# Patient Record
Sex: Male | Born: 1975 | Race: Black or African American | Hispanic: No | Marital: Married | Smoking: Never smoker
Health system: Southern US, Community
[De-identification: ages and names within clinical notes are randomized; demographics above are authoritative.]

## PROBLEM LIST (undated history)

## (undated) DIAGNOSIS — I82409 Acute embolism and thrombosis of unspecified deep veins of unspecified lower extremity: Secondary | ICD-10-CM

## (undated) HISTORY — PX: CHOLECYSTECTOMY: SHX55

## (undated) HISTORY — PX: APPENDECTOMY: SHX54

## (undated) HISTORY — PX: GALLBLADDER SURGERY: SHX652

---

## 2011-10-14 ENCOUNTER — Emergency Department (HOSPITAL_COMMUNITY)
Admission: EM | Admit: 2011-10-14 | Discharge: 2011-10-14 | Disposition: A | Discharge status: Home / Self Care | Payer: Self-pay | Attending: Emergency Medicine | Admitting: Emergency Medicine

## 2011-10-14 ENCOUNTER — Encounter: Payer: Self-pay | Admitting: Emergency Medicine

## 2011-10-14 DIAGNOSIS — J111 Influenza due to unidentified influenza virus with other respiratory manifestations: Secondary | ICD-10-CM | POA: Insufficient documentation

## 2011-10-14 NOTE — ED Notes (Signed)
Pt states that he is fever and chills and that he has bodyaches, cough, not feeling well and thinks that his wife is sick at home

## 2011-10-14 NOTE — ED Provider Notes (Signed)
History     CSN: [id number] Arrival date & time: 10/14/2011  9:40 AM   First MD Initiated Contact with Patient 10/14/11 1235      Chief Complaint  Patient presents with  . Cough  . Fever    (Consider location/radiation/quality/duration/timing/severity/associated sxs/prior treatment) Patient is a 35 y.o. male presenting with fever. The history is provided by the patient. No language interpreter was used.  Fever Primary symptoms of the febrile illness include fever, headaches, cough and myalgias. Primary symptoms do not include visual change, wheezing, shortness of breath, abdominal pain, nausea, vomiting, diarrhea, altered mental status or rash. The current episode started today. This is a new problem. The problem has been gradually worsening.  The headache is not associated with visual change.    History reviewed. No pertinent past medical history.  History reviewed. No pertinent past surgical history.  No family history on file.  History  Substance Use Topics  . Smoking status: Not on file  . Smokeless tobacco: Not on file  . Alcohol Use: Not on file      Review of Systems  Constitutional: Positive for fever.  Respiratory: Positive for cough. Negative for shortness of breath and wheezing.   Gastrointestinal: Negative for nausea, vomiting, abdominal pain and diarrhea.  Musculoskeletal: Positive for myalgias.  Skin: Negative for rash.  Neurological: Positive for headaches.  Psychiatric/Behavioral: Negative for altered mental status.  All other systems reviewed and are negative.    Allergies  Review of patient's allergies indicates not on file.  Home Medications  No current outpatient prescriptions on file.  BP 116/79  Pulse 99  Temp(Src) 99.5 F (37.5 C) (Oral)  Resp 24  SpO2 99%  Physical Exam  Constitutional: He is oriented to person, place, and time. He appears well-developed and well-nourished.  HENT:  Head: Normocephalic.  Right Ear: External ear  normal.  Left Ear: External ear normal.  Nose: Nose normal.  Mouth/Throat: Oropharynx is clear and moist. No oropharyngeal exudate.  Eyes: Pupils are equal, round, and reactive to light.  Neck: Normal range of motion.  Cardiovascular: Normal rate and regular rhythm.   Pulmonary/Chest: Effort normal and breath sounds normal. No respiratory distress. He has no wheezes. He exhibits no tenderness.  Abdominal: Soft. Bowel sounds are normal.  Neurological: He is alert and oriented to person, place, and time.  Skin: Skin is warm and dry.  Psychiatric: He has a normal mood and affect.    ED Course  Procedures (including critical care time)  Labs Reviewed - No data to display No results found.   No diagnosis found.    MDM  Influenza with sick contacts.  Treat symptoms with tylenol and motrin.  No acute distress noted.  Took nothing at home.  Afebrile in the ER.  Tolerating po's.        Jethro Bastos, NP 10/16/11 1418  Jethro Bastos, NP 10/16/11 1426

## 2011-10-16 NOTE — ED Provider Notes (Signed)
Medical screening examination/treatment/procedure(s) were performed by non-physician practitioner and as supervising physician I was immediately available for consultation/collaboration.  Jerimy Johanson, MD 10/16/11 1525 

## 2012-01-20 ENCOUNTER — Encounter (HOSPITAL_COMMUNITY): Payer: Self-pay | Admitting: Emergency Medicine

## 2012-01-20 ENCOUNTER — Emergency Department (HOSPITAL_COMMUNITY)
Admission: EM | Admit: 2012-01-20 | Discharge: 2012-01-20 | Disposition: A | Discharge status: Home / Self Care | Payer: Self-pay | Attending: Emergency Medicine | Admitting: Emergency Medicine

## 2012-01-20 DIAGNOSIS — X503XXA Overexertion from repetitive movements, initial encounter: Secondary | ICD-10-CM | POA: Insufficient documentation

## 2012-01-20 DIAGNOSIS — Y990 Civilian activity done for income or pay: Secondary | ICD-10-CM | POA: Insufficient documentation

## 2012-01-20 DIAGNOSIS — Y998 Other external cause status: Secondary | ICD-10-CM | POA: Insufficient documentation

## 2012-01-20 DIAGNOSIS — S335XXA Sprain of ligaments of lumbar spine, initial encounter: Secondary | ICD-10-CM | POA: Insufficient documentation

## 2012-01-20 DIAGNOSIS — B354 Tinea corporis: Secondary | ICD-10-CM | POA: Insufficient documentation

## 2012-01-20 DIAGNOSIS — T148XXA Other injury of unspecified body region, initial encounter: Secondary | ICD-10-CM

## 2012-01-20 MED ORDER — CLOTRIMAZOLE-BETAMETHASONE 1-0.05 % EX CREA: TOPICAL_CREAM | Freq: Two times a day (BID) | CUTANEOUS | Status: AC

## 2012-01-20 MED ORDER — CYCLOBENZAPRINE HCL 10 MG PO TABS: 10.0000 mg | ORAL_TABLET | Freq: Three times a day (TID) | ORAL | Status: AC | PRN

## 2012-01-20 MED ORDER — OXYCODONE-ACETAMINOPHEN 5-325 MG PO TABS: 1.0000 | ORAL_TABLET | Freq: Four times a day (QID) | ORAL | Status: AC | PRN

## 2012-01-20 NOTE — ED Provider Notes (Signed)
History     CSN: [id number]  Arrival date & time 01/20/12  4034   First MD Initiated Contact with Patient 01/20/12 [phone]      Chief Complaint  Patient presents with  . Back Pain  . Generalized Body Aches  . Rash    (Consider location/radiation/quality/duration/timing/severity/associated sxs/prior treatment) HPI  Patient presents to the emergency department with complaints of cold back muscle. 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 He states that yesterday morning it was worse than the first day and then this morning it hurt to roll out of bed. The patient went to work and was sent to the ED  because he was unable to do his duties. He denies bowel or urinary incontinence, patient is ambulatory without any focal or generalized weakness. He denies generalized body. The patient also has complaint of rash on his left arm and stomach that has been there for many weeks but he would like it checked out as long. States that it does not bother him until he gets out of the shower when it's really itchy.  History reviewed. No pertinent past medical history.  History reviewed. No pertinent past surgical history.  No family history on file.  History  Substance Use Topics  . Smoking status: Never Smoker   . Smokeless tobacco: Not on file  . Alcohol Use: No      Review of Systems  All other systems reviewed and are negative.    Allergies  Review of patient's allergies indicates no known allergies.  Home Medications   Current Outpatient Rx  Name Route Sig Dispense Refill  . IBUPROFEN 200 MG PO TABS Oral Take 800 mg by mouth every 6 (six) hours as needed. For pain    . CLOTRIMAZOLE-BETAMETHASONE 1-0.05 % EX CREA Topical Apply topically 2 (two) times daily. 30 g 0  . CYCLOBENZAPRINE HCL 10 MG PO TABS Oral Take 1 tablet (10 mg  total) by mouth 3 (three) times daily as needed for muscle spasms. 30 tablet 0  . OXYCODONE-ACETAMINOPHEN 5-325 MG PO TABS Oral Take 1 tablet by mouth every 6 (six) hours as needed for pain. 15 tablet 0    BP 126/74  Pulse 67  Temp(Src) 98.6 F (37 C) (Oral)  Resp 19  Wt 200 lb (90.719 kg)  SpO2 100%  Physical Exam  Nursing note and vitals reviewed. Constitutional: He appears well-developed and well-nourished. No distress.  HENT:  Head: Normocephalic and atraumatic.  Eyes: Pupils are equal, round, and reactive to light.  Neck: Normal range of motion. Neck supple.  Cardiovascular: Normal rate and regular rhythm.   Pulmonary/Chest: Effort normal.  Abdominal: Soft.  Musculoskeletal:       Lumbar back: He exhibits decreased range of motion (pt has DROM due to pain) and spasm. He exhibits no tenderness (no tenderness to palpation mid line or paraspinal), no bony tenderness, no swelling, no edema, no deformity, no laceration, no pain and normal pulse.  Neurological: He is alert.  Skin: Skin is warm and dry. Rash noted.          Pt has red and white flaky rash with central clearing     ED Course  Procedures (including critical care time)  Labs Reviewed - No data to display No results found.   1. Muscle  strain   2. Tinea corporis       MDM   Pt given RX for antifungal cream as well as muscle relaxer's and percocet's. Patient looked up on drug database and had no controlled substances prescriptions filled within the last year. Pt also given Ortho referral.  Pt has been advised of the symptoms that warrant their return to the ED. Patient has voiced understanding and has agreed to follow-up with the PCP or specialist.         Dorthula Matas, PA 01/20/12 1145

## 2012-01-20 NOTE — ED Notes (Signed)
Pt resting comfortably

## 2012-01-20 NOTE — ED Provider Notes (Signed)
Medical screening examination/treatment/procedure(s) were performed by non-physician practitioner and as supervising physician I was immediately available for consultation/collaboration.   Auri Jahnke, MD 01/20/12 1727 

## 2012-01-20 NOTE — ED Notes (Signed)
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

## 2012-01-20 NOTE — Discharge Instructions (Signed)
Ringworm, Body [Tinea Corporis] Ringworm is a fungal infection of the skin and hair. Another name for this problem is Tinea Corporis. It has nothing to do with worms. A fungus is an organism that lives on dead cells (the outer layer of skin). It can involve the entire body. It can spread from infected pets. Tinea corporis can be a problem in wrestlers who may get the infection form other players/opponents, equipment and mats. DIAGNOSIS  A skin scraping can be obtained from the affected area and by looking for fungus under the microscope. This is called a KOH examination.  HOME CARE INSTRUCTIONS   Ringworm may be treated with a topical antifungal cream, ointment, or oral medications.   If you are using a cream or ointment, wash infected skin. Dry it completely before application.   Scrub the skin with a buff puff or abrasive sponge using a shampoo with ketoconazole to remove dead skin and help treat the ringworm.   Have your pet treated by your veterinarian if it has the same infection.  SEEK MEDICAL CARE IF:   Your ringworm patch (fungus) continues to spread after 7 days of treatment.   Your rash is not gone in 4 weeks. Fungal infections are slow to respond to treatment. Some redness (erythema) may remain for several weeks after the fungus is gone.   The area becomes red, warm, tender, and swollen beyond the patch. This may be a secondary bacterial (germ) infection.   You have a fever.  Document Released: 10/23/2000 Document Revised: 10/15/2011 Document Reviewed: 04/05/2009 [facility] Patient Information 2012 [place], [place].Sprains Sprains are painful injuries to joints as a result of partial or complete tearing of ligaments. HOME CARE INSTRUCTIONS   For the first 24 hours, keep the injured limb raised on 2 pillows while lying down.   Apply ice bags about every 2 hours for 20 to 30 minutes, while awake, to the injured area for the first 24 hours. Then apply as directed by your caregiver.  Place the ice in a plastic bag with a towel around it to prevent frostbite to the skin.   Only take over-the-counter or prescription medicines for pain, discomfort, or fever as directed by your caregiver.   If an ace bandage (a stretchy, elastic wrapping bandage) has been applied today, remove and reapply every 3 to 4 hours. Apply firm enough to keep swelling down. Donot apply tightly. Watch fingers or toes for swelling, bluish discoloration, coldness, numbness, or excessive pain. If any of these problems (symptoms) occur, remove the ace bandage and reapply it more loosely. Contact your caregiver or return to this location if these symptoms persist.  Persistent pain and inability to use the injured area for more than 2 to 3 days are warning signs. See a caregiver for a follow-up visit as soon as possible. A hairline fracture (broken bone) may not show on X-rays. Persistent pain and swelling indicate that further evaluation, use of crutches, and/or more X-rays are needed. X-rays may sometimes not show a small fracture until a week or ten days later. Make a follow-up appointment with your own caregiver or to whom we have referred you. A specialist in reading X-rays(radiologist) will re-read your X-rays. Make sure you know how to obtain your X-ray results. Do not assume everything is normal if you do not hear from [place]. SEEK IMMEDIATE MEDICAL CARE IF:  You develop severe pain or more swelling.   The pain is not controlled with medicine.   Your skin or nails below  the injury turn blue or grey or feel cold or numb.  Document Released: 10/23/2000 Document Revised: 10/15/2011 Document Reviewed: 06/11/2008 [facility] Patient Information 2012 [place], [place].

## 2012-01-20 NOTE — ED Notes (Signed)
States that he has a skin irritation on his left arm, abd, and groin. Denies any new routines or contact with anything

## 2013-02-23 ENCOUNTER — Encounter (HOSPITAL_COMMUNITY): Payer: Self-pay | Admitting: Emergency Medicine

## 2013-02-23 ENCOUNTER — Emergency Department (HOSPITAL_COMMUNITY)
Admission: EM | Admit: 2013-02-23 | Discharge: 2013-02-23 | Disposition: A | Discharge status: Home / Self Care | Payer: Self-pay | Attending: Emergency Medicine | Admitting: Emergency Medicine

## 2013-02-23 DIAGNOSIS — A088 Other specified intestinal infections: Secondary | ICD-10-CM | POA: Insufficient documentation

## 2013-02-23 DIAGNOSIS — R197 Diarrhea, unspecified: Secondary | ICD-10-CM | POA: Insufficient documentation

## 2013-02-23 DIAGNOSIS — R1013 Epigastric pain: Secondary | ICD-10-CM | POA: Insufficient documentation

## 2013-02-23 DIAGNOSIS — A084 Viral intestinal infection, unspecified: Secondary | ICD-10-CM

## 2013-02-23 LAB — CBC WITH DIFFERENTIAL/PLATELET
Basophils Absolute: 0 10*3/uL (ref 0.0–0.1)
Basophils Relative: 0 % (ref 0–1)
Eosinophils Absolute: 0.1 10*3/uL (ref 0.0–0.7)
Eosinophils Relative: 1 % (ref 0–5)
Lymphs Abs: 2.3 10*3/uL (ref 0.7–4.0)
MCH: 27 pg (ref 26.0–34.0)
MCHC: 33.8 g/dL (ref 30.0–36.0)
MCV: 79.8 fL (ref 78.0–100.0)
Neutrophils Relative %: 42 % — ABNORMAL LOW (ref 43–77)
Platelets: 185 10*3/uL (ref 150–400)
RBC: 6.15 MIL/uL — ABNORMAL HIGH (ref 4.22–5.81)

## 2013-02-23 LAB — COMPREHENSIVE METABOLIC PANEL
ALT: 32 U/L (ref 0–53)
AST: 22 U/L (ref 0–37)
Alkaline Phosphatase: 65 U/L (ref 39–117)
CO2: 24 mEq/L (ref 19–32)
Chloride: 107 mEq/L (ref 96–112)
Creatinine, Ser: 1.19 mg/dL (ref 0.50–1.35)
GFR calc non Af Amer: 76 mL/min — ABNORMAL LOW (ref >90)
Total Bilirubin: 0.7 mg/dL (ref 0.3–1.2)

## 2013-02-23 MED ORDER — ONDANSETRON HCL 4 MG/2ML IJ SOLN
4.0000 mg | Freq: Once | INTRAMUSCULAR | Status: AC
  Administered 2013-02-23: 4 mg via INTRAVENOUS
  Filled 2013-02-23: qty 2

## 2013-02-23 MED ORDER — ONDANSETRON HCL 4 MG PO TABS: 4.0000 mg | ORAL_TABLET | Freq: Four times a day (QID) | ORAL | Status: DC

## 2013-02-23 MED ORDER — SODIUM CHLORIDE 0.9 % IV BOLUS (SEPSIS)
1000.0000 mL | Freq: Once | INTRAVENOUS | Status: AC
  Administered 2013-02-23: 1000 mL via INTRAVENOUS

## 2013-02-23 NOTE — ED Provider Notes (Signed)
History     CSN: [id number]  Arrival date & time 02/23/13  4098   First MD Initiated Contact with Patient 02/23/13 [phone]      Chief Complaint  Patient presents with  . Emesis  . Diarrhea    (Consider location/radiation/quality/duration/timing/severity/associated sxs/prior treatment) Patient is a 37 y.o. male presenting with vomiting and diarrhea. The history is provided by the patient.  Emesis Severity:  Moderate Duration:  12 hours Timing:  Intermittent Number of daily episodes:  6 Quality:  Stomach contents (small flecks of blood in some of vomit) Feeding tolerance: neither. Progression:  Unchanged Chronicity:  New Recent urination:  Normal Relieved by:  Nothing Associated symptoms: abdominal pain, chills and diarrhea   Associated symptoms: no cough   Abdominal pain:    Location:  Epigastric   Quality:  Cramping   Severity:  Moderate   Onset quality:  Sudden   Timing:  Intermittent   Progression:  Unchanged (currently pt is not having stomach pain) Diarrhea:    Quality:  Watery   Number of occurrences:  10   Severity:  Severe   Timing:  Intermittent Risk factors: no alcohol use, no diabetes and no sick contacts   Diarrhea Associated symptoms: abdominal pain, chills and vomiting   Associated symptoms: no recent cough     History reviewed. No pertinent past medical history.  Past Surgical History  Procedure Laterality Date  . Cholecystectomy    . Appendectomy      No family history on file.  History  Substance Use Topics  . Smoking status: Never Smoker   . Smokeless tobacco: Not on file  . Alcohol Use: No      Review of Systems  Constitutional: Positive for chills.  Gastrointestinal: Positive for vomiting, abdominal pain and diarrhea.  All other systems reviewed and are negative.    Allergies  Review of patient's allergies indicates no known allergies.  Home Medications   Current Outpatient Rx  Name  Route  Sig  Dispense  Refill  .  ibuprofen (ADVIL,MOTRIN) 200 MG tablet   Oral   Take 800 mg by mouth every 6 (six) hours as needed. For pain           BP 137/96  Pulse 63  Temp(Src) 98 F (36.7 C) (Oral)  Resp 17  SpO2 100%  Physical Exam  Nursing note and vitals reviewed. Constitutional: He is oriented to person, place, and time. He appears well-developed and well-nourished. No distress.  HENT:  Head: Normocephalic and atraumatic.  Mouth/Throat: Oropharynx is clear and moist.  Eyes: Conjunctivae and EOM are normal. Pupils are equal, round, and reactive to light.  Neck: Normal range of motion. Neck supple.  Cardiovascular: Normal rate, regular rhythm and intact distal pulses.   No murmur heard. Pulmonary/Chest: Effort normal and breath sounds normal. No respiratory distress. He has no wheezes. He has no rales.  Abdominal: Soft. He exhibits no distension. There is no tenderness. There is no rebound and no guarding.  Musculoskeletal: Normal range of motion. He exhibits no edema and no tenderness.  Neurological: He is alert and oriented to person, place, and time.  Skin: Skin is warm and dry. No rash noted. No erythema.  Psychiatric: He has a normal mood and affect. His behavior is normal.    ED Course  Procedures (including critical care time)  Labs Reviewed  CBC WITH DIFFERENTIAL - Abnormal; Notable for the following:    RBC 6.15 (*)    Neutrophils Relative 42 (*)  Monocytes Relative 16 (*)    All other components within normal limits  COMPREHENSIVE METABOLIC PANEL - Abnormal; Notable for the following:    Glucose, Bld 107 (*)    GFR calc non Af Amer 76 (*)    GFR calc Af Amer 88 (*)    All other components within normal limits   No results found.   1. Viral gastroenteritis       MDM    Pt with symptoms most consistent with a viral process with fever/vomitting/diarrhea.  Denies bad food exposure and recent travel out of the country.  No recent abx.  No hx concerning for GU pathology or  kidney stones.  Pt is awake and alert on exam without peritoneal signs.  10:09 AM Labs unremarkable and po challenging.   10:34 AM Tolerating po's.  Feeling better and wants to go home.    Gwyneth Sprout, MD 02/23/13 1034

## 2013-02-23 NOTE — ED Notes (Signed)
States that he has been vomiting and having diarrhea for the past 2 days. States that he is unable to keep liquids or foods down.

## 2013-04-04 ENCOUNTER — Encounter (HOSPITAL_COMMUNITY): Payer: Self-pay

## 2013-04-04 ENCOUNTER — Emergency Department (HOSPITAL_COMMUNITY)
Admission: EM | Admit: 2013-04-04 | Discharge: 2013-04-04 | Disposition: A | Discharge status: Home / Self Care | Payer: Self-pay | Attending: Emergency Medicine | Admitting: Emergency Medicine

## 2013-04-04 DIAGNOSIS — Y939 Activity, unspecified: Secondary | ICD-10-CM | POA: Insufficient documentation

## 2013-04-04 DIAGNOSIS — W268XXA Contact with other sharp object(s), not elsewhere classified, initial encounter: Secondary | ICD-10-CM | POA: Insufficient documentation

## 2013-04-04 DIAGNOSIS — S61209A Unspecified open wound of unspecified finger without damage to nail, initial encounter: Secondary | ICD-10-CM | POA: Insufficient documentation

## 2013-04-04 DIAGNOSIS — Y92009 Unspecified place in unspecified non-institutional (private) residence as the place of occurrence of the external cause: Secondary | ICD-10-CM | POA: Insufficient documentation

## 2013-04-04 DIAGNOSIS — S61219A Laceration without foreign body of unspecified finger without damage to nail, initial encounter: Secondary | ICD-10-CM

## 2013-04-04 MED ORDER — TETANUS-DIPHTH-ACELL PERTUSSIS 5-2.5-18.5 LF-MCG/0.5 IM SUSP
0.5000 mL | Freq: Once | INTRAMUSCULAR | Status: DC
  Filled 2013-04-04: qty 0.5

## 2013-04-04 NOTE — ED Notes (Signed)
Patient reports that he cut his left index finger on a piece of metal.

## 2013-04-04 NOTE — Progress Notes (Signed)
[facility] has seen patient and provided him with a list of primary care resources. °

## 2013-04-04 NOTE — ED Provider Notes (Signed)
History     CSN: [id number]  Arrival date & time 04/04/13  0941   First MD Initiated Contact with Patient 04/04/13 1006      Chief Complaint  Patient presents with  . finger laceration     (Consider location/radiation/quality/duration/timing/severity/associated sxs/prior treatment) The history is provided by the patient.  s/p accidental left index finger laceration this morning just pta, at home.  On edge of metal, no fb. Superficial lac to dorsum index finger just distal to pip. No numbness. Normal rom. Denies other injury. Tetanus unknown.      History reviewed. No pertinent past medical history.  Past Surgical History  Procedure Laterality Date  . Cholecystectomy    . Appendectomy      Family History  Problem Relation Age of Onset  . Diabetes Mother     History  Substance Use Topics  . Smoking status: Never Smoker   . Smokeless tobacco: Never Used  . Alcohol Use: No      Review of Systems  Constitutional: Negative for fever.  Skin: Positive for wound.  Neurological: Negative for numbness.    Allergies  Review of patient's allergies indicates no known allergies.  Home Medications   Current Outpatient Rx  Name  Route  Sig  Dispense  Refill  . ibuprofen (ADVIL,MOTRIN) 200 MG tablet   Oral   Take 400 mg by mouth every 4 (four) hours as needed for pain.          Marland Kitchen ondansetron (ZOFRAN) 4 MG tablet   Oral   Take 1 tablet (4 mg total) by mouth every 6 (six) hours.   12 tablet   0     BP 122/69  Pulse 69  Temp(Src) 98.4 F (36.9 C) (Oral)  Resp 18  Ht 5\' 10"  (1.778 m)  Wt 214 lb (97.07 kg)  BMI 30.71 kg/m2  SpO2 97%  Physical Exam  Nursing note and vitals reviewed. Constitutional: He appears well-developed and well-nourished. No distress.  HENT:  Head: Atraumatic.  Eyes: Conjunctivae are normal.  Neck: Neck supple. No tracheal deviation present.  Cardiovascular: Normal rate.   Pulmonary/Chest: Effort normal. No accessory muscle usage.  No respiratory distress.  Musculoskeletal: Normal range of motion.  1 cm lac, superficial, to dorsum left index finger just distal to pip. No bone/joint exposed. No fb seen or felt. Normal ext tendon fxn. Normal cap refill distally. Finger nvi.   Neurological: He is alert.  Skin: Skin is warm and dry.  Psychiatric: He has a normal mood and affect.    ED Course  Procedures (including critical care time)    1. Finger laceration, initial encounter       MDM  LACERATION REPAIR Performed by: Suzi Roots Authorized by: Suzi Roots Consent: Verbal consent obtained. Risks and benefits: risks, benefits and alternatives were discussed Consent given by: patient Patient identity confirmed: provided demographic data Prepped and Draped in normal sterile fashion Wound explored  Laceration Location: left index finger  Laceration Length: 1cm  No Foreign Bodies seen or palpated  Anesthesia: local infiltration  Local anesthetic: lidocaine 2% wo epinephrine  Anesthetic total: 1 ml  Irrigation method: syringe Amount of cleaning: standard  Skin closure: 4-0 prolene  Number of sutures: 2  Technique: simple interrupted  Patient tolerance: Patient tolerated the procedure well with no immediate complications.     Sterile dressing applied.  Tetanus im.      Suzi Roots, MD 04/04/13 1037

## 2013-07-29 ENCOUNTER — Encounter (HOSPITAL_COMMUNITY): Payer: Self-pay | Admitting: Emergency Medicine

## 2013-07-29 ENCOUNTER — Emergency Department (HOSPITAL_COMMUNITY)
Admission: EM | Admit: 2013-07-29 | Discharge: 2013-07-29 | Disposition: A | Discharge status: Home / Self Care | Payer: Self-pay | Attending: Emergency Medicine | Admitting: Emergency Medicine

## 2013-07-29 DIAGNOSIS — R55 Syncope and collapse: Secondary | ICD-10-CM | POA: Insufficient documentation

## 2013-07-29 DIAGNOSIS — R61 Generalized hyperhidrosis: Secondary | ICD-10-CM | POA: Insufficient documentation

## 2013-07-29 DIAGNOSIS — E162 Hypoglycemia, unspecified: Secondary | ICD-10-CM | POA: Insufficient documentation

## 2013-07-29 DIAGNOSIS — R5381 Other malaise: Secondary | ICD-10-CM | POA: Insufficient documentation

## 2013-07-29 MED ORDER — SODIUM CHLORIDE 0.9 % IV BOLUS (SEPSIS): 1000.0000 mL | INTRAVENOUS | Status: DC

## 2013-07-29 NOTE — ED Provider Notes (Signed)
CSN: [id number]     Arrival date & time 07/29/13  4034 History   First MD Initiated Contact with Patient 07/29/13 1008     Chief Complaint  Patient presents with  . Dizziness  . clammy    (Consider location/radiation/quality/duration/timing/severity/associated sxs/prior Treatment) HPI Pt is a 37yo male c/o feeling dizzy and clammy 1hr PTA.  States he ate breakfast consisting of a sausage biscuit and Mtn. Dew for breakfast and about later started to feel heavy, sweaty, and dizzy. States he felt like he was going to pass out so he sat on the floor for about . A co-worker gave him a soda which helped a lot with his symptoms but wanted to come get checked out.  Reports similar episodes in the past for the last several months but today's episode lasted longer than others. Denies hx of diabetes but does not go to doctor regularly.  Reports family hx of DM.  Denies symptoms at this time. Denies chest pain or trouble breathing during episodes. Denies recent illness or recent travel.  History reviewed. No pertinent past medical history. Past Surgical History  Procedure Laterality Date  . Cholecystectomy    . Appendectomy     Family History  Problem Relation Age of Onset  . Diabetes Mother    History  Substance Use Topics  . Smoking status: Never Smoker   . Smokeless tobacco: Never Used  . Alcohol Use: No    Review of Systems  Constitutional: Positive for diaphoresis.  Neurological: Positive for weakness and light-headedness. Negative for syncope.  All other systems reviewed and are negative.    Allergies  Review of patient's allergies indicates no known allergies.  Home Medications  No current outpatient prescriptions on file. BP 128/79  Pulse 57  Temp(Src) 97.6 F (36.4 C) (Oral)  Resp 15  SpO2 98% Physical Exam  Nursing note and vitals reviewed. Constitutional: He is oriented to person, place, and time. He appears well-developed and well-nourished.  Pt sitting  comfortably in exam bed, NAD.    HENT:  Head: Normocephalic and atraumatic.  Eyes: Conjunctivae are normal. No scleral icterus.  Neck: Normal range of motion.  Cardiovascular: Normal rate, regular rhythm and normal heart sounds.   Pulmonary/Chest: Effort normal and breath sounds normal. No respiratory distress. He has no wheezes. He has no rales. He exhibits no tenderness.  Abdominal: Soft. Bowel sounds are normal. He exhibits no distension and no mass. There is no tenderness. There is no rebound and no guarding.  Musculoskeletal: Normal range of motion.  Neurological: He is alert and oriented to person, place, and time. He has normal strength. No cranial nerve deficit or sensory deficit. He displays a negative Romberg sign. Coordination and gait normal. GCS eye subscore is 4. GCS verbal subscore is 5. GCS motor subscore is 6.  CN II-XII in tact, no focal deficit, nl finger to nose coordination. Nl sensation, 5/5 strength in all major muscle groups. Neg romberg and nl gait.   Skin: Skin is warm and dry.    ED Course  Procedures (including critical care time) Labs Review Labs Reviewed  GLUCOSE, CAPILLARY   Imaging Review No results found.   Date: 07/29/2013  Rate: 54  Rhythm: normal sinus rhythm  QRS Axis: normal  Intervals: normal  ST/T Wave abnormalities: normal  Conduction Disutrbances:none  Narrative Interpretation:   Old EKG Reviewed: none available    MDM   1. Near syncope   2. Low blood sugar    Pt description  of episodes of near syncope, resolving after consumption of foods or sugary drinks is consistent with low blood sugar.  CBG today was 76 after pt admits to drinking soda just PTA, likely was much lower than this when pt was experiencing symptoms as pt states he is feeling fine now.  Neuro exam unremarkable. Pt denied chest pain or SOB.  EKG: unremarkable. Offered to give pt IV fluids, pt declined and asked if he could just drink something instead. Not concerned for  emergent process taking place at this time.  No further workup needed.    All labs/imaging/findings discussed with patient. All questions answered and concerns addressed. Will discharge pt home and have pt f/u with [facility] info provided. Encouraged pt to eat several small meals a day and to keep snacks such as peanut butter crackers or chocolate with him at all times so he can eat if he starts feeling this way again.  Return precautions given. Pt verbalized understanding and agreement with tx plan. Vitals: unremarkable. Discharged in stable condition.    Discussed pt with attending during ED encounter and agrees with plan.     Junius Finner, PA-C 07/29/13 1109

## 2013-07-29 NOTE — ED Notes (Signed)
Pt states about an hour ago he started feeling heavy, sweaty, and dizzy. States he sat down on the floor for about 30 mins. Has had symptoms off and on for the past month or so but this episode was the worse.

## 2013-07-29 NOTE — ED Notes (Signed)
Pt states he ate breakfast this morning 20 mins before and afterwards he started feeling like that. After drinking a soda pt states he started feeling a little better.

## 2013-07-29 NOTE — ED Provider Notes (Signed)
Medical screening examination/treatment/procedure(s) were performed by non-physician practitioner and as supervising physician I was immediately available for consultation/collaboration.   Eevie Lapp Y. Destin Kittler, MD 07/29/13 1113 

## 2013-08-10 ENCOUNTER — Ambulatory Visit: Payer: Self-pay | Attending: Internal Medicine | Admitting: Internal Medicine

## 2013-08-10 ENCOUNTER — Encounter: Payer: Self-pay | Admitting: Internal Medicine

## 2013-08-10 VITALS — BP 111/67 | HR 54 | Temp 98.3°F | Resp 18 | Ht 69.29 in | Wt 214.0 lb

## 2013-08-10 DIAGNOSIS — E162 Hypoglycemia, unspecified: Secondary | ICD-10-CM | POA: Insufficient documentation

## 2013-08-10 DIAGNOSIS — E15 Nondiabetic hypoglycemic coma: Secondary | ICD-10-CM

## 2013-08-10 LAB — GLUCOSE, POCT (MANUAL RESULT ENTRY): POC Glucose: 100 mg/dl — AB (ref 70–99)

## 2013-08-10 LAB — TSH: TSH: 1.1 u[IU]/mL (ref 0.350–4.500)

## 2013-08-10 LAB — LIPID PANEL
LDL Cholesterol: 114 mg/dL — ABNORMAL HIGH (ref 0–99)
Total CHOL/HDL Ratio: 4.6 Ratio
VLDL: 31 mg/dL (ref 0–40)

## 2013-08-10 MED ORDER — FREESTYLE SYSTEM KIT: 1.0000 | PACK | Freq: Three times a day (TID) | Status: DC

## 2013-08-10 NOTE — Patient Instructions (Addendum)
Accuchecks 4 times/day, Once in AM empty stomach and then before each meal. Log in all results and show them to your Prim.MD in2 weeks If any glucose reading is under 80 or above 300 call your Prim MD immidiately. Follow Low glucose instructions for glucose under 80 as instructed.  Take frequent small meals. Do not drive if you are symptomatic

## 2013-08-10 NOTE — Progress Notes (Unsigned)
Pt is here for a hosp f/u  Concerned for DM CBG today is 100 non fasting C/o intermittent dizzy spells He is alert w/no signs of acute distress.

## 2013-08-10 NOTE — Progress Notes (Unsigned)
Patient ID: Roberto Bates, male   DOB: 1976/11/06, 37 y.o.   MRN: [id number]  Patient Demographics  Roberto Bates, is a 37 y.o. male  CSN: [id number]  MRN: [id number]  DOB - 1976/08/29  Outpatient Primary MD for the patient is Jeanann Lewandowsky, MD   With History of -  No past medical history on file.    Past Surgical History  Procedure Laterality Date  . Cholecystectomy    . Appendectomy      in for   Chief Complaint  Patient presents with  . Hospitalization Follow-up     HPI  Roberto Bates  is a 37 y.o. male,  who has no previous medical complaints, is on no medications does not take any over-the-counter supplements, comes in to establish care, patient has been having several episodes of feeling weak all over along with mild sweating, feels better if he consumes something sweet, on one such episode a few weeks ago he went to the ER, he did take sweet beverage before going to the ER, upon arriving to the ER he was noted to have a CBG of 76. He was advised to take frequent small meals and sent here for further evaluation.    He denies any unintentional weight loss, no exposure to diabetic medications or insulin, does not take any over-the-counter medications or supplements. No abnormal pain. No chest pain or palpitations. No shortness of breath . No diarrhea no blood in stool or urine. No dysuria    Review of Systems    In addition to the HPI above,   No Fever-chills, No Headache, No changes with Vision or hearing, No problems swallowing food or Liquids, No Chest pain, Cough or Shortness of Breath, No Abdominal pain, No Nausea or Vommitting, Bowel movements are regular, No Blood in stool or Urine, No dysuria, No new skin rashes or bruises, No new joints pains-aches,  No new weakness, tingling, numbness in any extremity, No recent weight gain or loss, No polyuria, polydypsia or polyphagia, No significant Mental Stressors.  A full 10 point Review of Systems was  done, except as stated above, all other Review of Systems were negative.   Social History History  Substance Use Topics  . Smoking status: Never Smoker   . Smokeless tobacco: Never Used  . Alcohol Use: No      Family History Family History  Problem Relation Age of Onset  . Diabetes Mother       Prior to Admission medications   Medication Sig Start Date End Date Taking? Authorizing Provider  glucose monitoring kit (FREESTYLE) monitoring kit 1 each by Does not apply route 4 (four) times daily - after meals and at bedtime. 1 month Diabetic Testing Supplies for QAC-QHS accuchecks.Any brand. 08/10/13   Leroy Sea, MD    No Known Allergies  Physical Exam  Vitals  Blood pressure 111/67, pulse 54, temperature 98.3 F (36.8 C), temperature source Oral, resp. rate 18, height 5' 9.29" (1.76 m), weight 214 lb (97.07 kg), SpO2 98.00%.   1. General well-built middle-aged African American male sitting on clinic examination table in no apparent distress,     2. Normal affect and insight, Not Suicidal or Homicidal, Awake Alert, Oriented X 3.  3. No F.N deficits, ALL C.Nerves Intact, Strength 5/5 all 4 extremities, Sensation intact all 4 extremities, Plantars down going.  4. Ears and Eyes appear Normal, Conjunctivae clear, PERRLA. Moist Oral Mucosa.  5. Supple Neck, No JVD, No cervical lymphadenopathy appriciated, No Carotid Bruits.  6. Symmetrical Chest wall movement, Good air movement bilaterally, CTAB.  7. RRR, No Gallops, Rubs or Murmurs, No Parasternal Heave.  8. Positive Bowel Sounds, Abdomen Soft, Non tender, No organomegaly appriciated,No rebound -guarding or rigidity.  9.  No Cyanosis, Normal Skin Turgor, No Skin Rash or Bruise.  10. Good muscle tone,  joints appear normal , no effusions, Normal ROM.  11. No Palpable Lymph Nodes in Neck or Axillae     Data Review  CBC No results found for this basename: WBC, HGB, HCT, PLT, MCV, MCH, MCHC, RDW, NEUTRABS,  LYMPHSABS, MONOABS, EOSABS, BASOSABS, BANDABS, BANDSABD,  in the last 168 hours ------------------------------------------------------------------------------------------------------------------  Chemistries  No results found for this basename: NA, K, CL, CO2, GLUCOSE, BUN, CREATININE, GFRCGP, CALCIUM, MG, AST, ALT, ALKPHOS, BILITOT,  in the last 168 hours ------------------------------------------------------------------------------------------------------------------ estimated creatinine clearance is 96.3 ml/min (by C-G formula based on Cr of 1.19). ------------------------------------------------------------------------------------------------------------------ No results found for this basename: TSH, T4TOTAL, FREET3, T3FREE, THYROIDAB,  in the last 72 hours   Coagulation profile No results found for this basename: INR, PROTIME,  in the last 168 hours ------------------------------------------------------------------------------------------------------------------- No results found for this basename: DDIMER,  in the last 72 hours -------------------------------------------------------------------------------------------------------------------  Cardiac Enzymes No results found for this basename: CK, CKMB, TROPONINI, MYOGLOBIN,  in the last 168 hours ------------------------------------------------------------------------------------------------------------------ No components found with this basename: POCBNP,    ---------------------------------------------------------------------------------------------------------------  Urinalysis No results found for this basename: colorurine, appearanceur, labspec, phurine, glucoseu, hgbur, bilirubinur, ketonesur, proteinur, urobilinogen, nitrite, leukocytesur       Assessment and plan  1. Episodes of hypoglycemia. Unclear etiology, glucose was 76 after he had drank sweet beverage in the ER last visit, have ordered A1c, given him a  glucometer and testing supplies and requested him to check himself q. a.c. at bedtime and if symptoms recur. Asked him to maintain a logbook. Referred him to endocrinologist. Have ordered CBG, A1c, BHB, C-peptide, insulin levels, sulfonylurea panel, TSH, Proinsulin cannot be done at our clinic. Asked him to take 20 small meals and not drive if symptomatic.   Routine health maintenance.  Screening labs. CBC, BMP done recently in the ER are stable, have ordered TSH, lipid panel and A1c here.     Flu shot and tetanus shot given     Leroy Sea M.D on 08/10/2013 at 11:57 AM

## 2013-08-11 LAB — C-PEPTIDE: C-Peptide: 2.29 ng/mL (ref 0.80–3.90)

## 2013-08-16 LAB — INSULIN ANTIBODIES, BLOOD: Insulin Antibodies, Human: <0.4 U/mL (ref <0.4)

## 2013-08-24 LAB — SULFONYLUREA HYPOGLYCEMICS PANEL, URINE
Glimepiride, ur: NEGATIVE ng/mL
Nateglinide, ur: NEGATIVE ng/mL
Repaglinide, ur: NEGATIVE ng/mL

## 2013-10-23 ENCOUNTER — Telehealth: Payer: Self-pay | Admitting: Emergency Medicine

## 2013-10-23 NOTE — Telephone Encounter (Signed)
Pt comes in requesting lab results. Pt shown negative lab results. Concerned of hypoglycemic panel. According to results,negative. Pt will monitor blood sugar and f/u as needed

## 2013-12-30 ENCOUNTER — Emergency Department (HOSPITAL_COMMUNITY)
Admission: EM | Admit: 2013-12-30 | Discharge: 2013-12-30 | Disposition: A | Discharge status: Home / Self Care | Payer: Self-pay | Attending: Emergency Medicine | Admitting: Emergency Medicine

## 2013-12-30 ENCOUNTER — Encounter (HOSPITAL_COMMUNITY): Payer: Self-pay | Admitting: Emergency Medicine

## 2013-12-30 DIAGNOSIS — R079 Chest pain, unspecified: Secondary | ICD-10-CM | POA: Insufficient documentation

## 2013-12-30 DIAGNOSIS — J069 Acute upper respiratory infection, unspecified: Secondary | ICD-10-CM | POA: Insufficient documentation

## 2013-12-30 DIAGNOSIS — R197 Diarrhea, unspecified: Secondary | ICD-10-CM | POA: Insufficient documentation

## 2013-12-30 MED ORDER — HYDROCODONE-HOMATROPINE 5-1.5 MG/5ML PO SYRP: 5.0000 mL | ORAL_SOLUTION | Freq: Four times a day (QID) | ORAL | Status: DC | PRN

## 2013-12-30 MED ORDER — OXYMETAZOLINE HCL 0.05 % NA SOLN: 1.0000 | Freq: Two times a day (BID) | NASAL | Status: DC

## 2013-12-30 NOTE — ED Provider Notes (Signed)
Medical screening examination/treatment/procedure(s) were performed by non-physician practitioner and as supervising physician I was immediately available for consultation/collaboration.  EKG Interpretation   None        Raeford RazorStephen Eliza Grissinger, MD 12/30/13 [phone]

## 2013-12-30 NOTE — ED Provider Notes (Signed)
CSN: [id number]     Arrival date & time 12/30/13  [id number] History   First MD Initiated Contact with Patient 12/30/13 [phone]     Chief Complaint  Patient presents with  . Sore Throat  . Nasal Congestion     (Consider location/radiation/quality/duration/timing/severity/associated sxs/prior Treatment) HPI Patient presents with two days of nasal congestion with green discharge, sore throat, cough, chest soreness with coughing only.  Denies fevers, chills, body aches, SOB.  Wakes up at night due to nasal congestion and dry mouth.  Has taken mucinex without improvement.  No known sick contacts.  He did get flu shot this year.    History reviewed. No pertinent past medical history. Past Surgical History  Procedure Laterality Date  . Cholecystectomy    . Appendectomy     Family History  Problem Relation Age of Onset  . Diabetes Mother    History  Substance Use Topics  . Smoking status: Never Smoker   . Smokeless tobacco: Never Used  . Alcohol Use: No    Review of Systems  Constitutional: Negative for fever and chills.  HENT: Positive for congestion and sore throat. Negative for trouble swallowing.   Respiratory: Positive for cough. Negative for shortness of breath.   Cardiovascular: Positive for chest pain.  Gastrointestinal: Positive for diarrhea. Negative for nausea, vomiting and abdominal pain.  Genitourinary: Negative for dysuria and difficulty urinating.  All other systems reviewed and are negative.      Allergies  Review of patient's allergies indicates no known allergies.  Home Medications  No current outpatient prescriptions on file. BP 132/80  Pulse 68  Temp(Src) 97.8 F (36.6 C) (Oral)  Resp 18  SpO2 100% Physical Exam  Nursing note and vitals reviewed. Constitutional: He appears well-developed and well-nourished. No distress.  HENT:  Head: Normocephalic and atraumatic.  Nose: No mucosal edema. Right sinus exhibits no maxillary sinus tenderness and no frontal  sinus tenderness. Left sinus exhibits no maxillary sinus tenderness and no frontal sinus tenderness.  Mouth/Throat: Uvula is midline. Mucous membranes are not dry. No uvula swelling. Posterior oropharyngeal edema and posterior oropharyngeal erythema present. No oropharyngeal exudate or tonsillar abscesses.  Neck: Normal range of motion. Neck supple.  Pulmonary/Chest: Effort normal and breath sounds normal. No stridor. No respiratory distress. He has no wheezes. He has no rales.  Lymphadenopathy:    He has no cervical adenopathy.  Neurological: He is alert.  Skin: He is not diaphoretic.    ED Course  Procedures (including critical care time) Labs Review Labs Reviewed - No data to display Imaging Review No results found.  EKG Interpretation   None       MDM   Final diagnoses:  URI (upper respiratory infection)    Afebrile, nontoxic patient with constellation of symptoms suggestive of viral syndrome.  No concerning findings on exam.  Discharged home with supportive care, PCP follow up. Discussed findings, treatment, and follow up  with patient.  Pt given return precautions.  Pt verbalizes understanding and agrees with plan.        FortunaEmily Lillie Portner, PA-C 12/30/13 [phone]

## 2013-12-30 NOTE — Discharge Instructions (Signed)
Read the information below.  Use the prescribed medication as directed.  Please discuss all new medications with your pharmacist.  You may return to the Emergency Department at any time for worsening condition or any new symptoms that concern you.  If you develop high fevers that do not resolve with tylenol or ibuprofen, you have difficulty swallowing or breathing, or you are unable to tolerate fluids by mouth, return to the ER for a recheck.     Antibiotic Nonuse  Your caregiver felt that the infection or problem was not one that would be helped with an antibiotic. Infections may be caused by viruses or bacteria. Only a caregiver can tell which one of these is the likely cause of an illness. A cold is the most common cause of infection in both adults and children. A cold is a virus. Antibiotic treatment will have no effect on a viral infection. Viruses can lead to many lost days of work caring for sick children and many missed days of school. Children may catch as many as 10 "colds" or "flus" per year during which they can be tearful, cranky, and uncomfortable. The goal of treating a virus is aimed at keeping the ill person comfortable. Antibiotics are medications used to help the body fight bacterial infections. There are relatively few types of bacteria that cause infections but there are hundreds of viruses. While both viruses and bacteria cause infection they are very different types of germs. A viral infection will typically go away by itself within 7 to 10 days. Bacterial infections may spread or get worse without antibiotic treatment. Examples of bacterial infections are:  Sore throats (like strep throat or tonsillitis).  Infection in the lung (pneumonia).  Ear and skin infections. Examples of viral infections are:  Colds or flus.  Most coughs and bronchitis.  Sore throats not caused by Strep.  Runny noses. It is often best not to take an antibiotic when a viral infection is the cause  of the problem. Antibiotics can kill off the helpful bacteria that we have inside our body and allow harmful bacteria to start growing. Antibiotics can cause side effects such as allergies, nausea, and diarrhea without helping to improve the symptoms of the viral infection. Additionally, repeated uses of antibiotics can cause bacteria inside of our body to become resistant. That resistance can be passed onto harmful bacterial. The next time you have an infection it may be harder to treat if antibiotics are used when they are not needed. Not treating with antibiotics allows our own immune system to develop and take care of infections more efficiently. Also, antibiotics will work better for us when they are prescribed for bacterial infections. Treatments for a child that is ill may include:  Give extra fluids throughout the day to stay hydrated.  Get plenty of rest.  Only give your child over-the-counter or prescription medicines for pain, discomfort, or fever as directed by your caregiver.  The use of a cool mist humidifier may help stuffy noses.  Cold medications if suggested by your caregiver. Your caregiver may decide to start you on an antibiotic if:  The problem you were seen for today continues for a longer length of time than expected.  You develop a secondary bacterial infection. SEEK MEDICAL CARE IF:  Fever lasts longer than 5 days.  Symptoms continue to get worse after 5 to 7 days or become severe.  Difficulty in breathing develops.  Signs of dehydration develop (poor drinking, rare urinating, dark colored urine).  Changes in behavior or worsening tiredness (listlessness or lethargy). Document Released: 01/04/2002 Document Revised: 01/18/2012 Document Reviewed: 07/03/2009 [facility] Patient Information 2014 [place], [place].  Viral Infections A viral infection can be caused by different types of viruses.Most viral infections are not serious and resolve on their own. However,  some infections may cause severe symptoms and may lead to further complications. SYMPTOMS Viruses can frequently cause:  Minor sore throat.  Aches and pains.  Headaches.  Runny nose.  Different types of rashes.  Watery eyes.  Tiredness.  Cough.  Loss of appetite.  Gastrointestinal infections, resulting in nausea, vomiting, and diarrhea. These symptoms do not respond to antibiotics because the infection is not caused by bacteria. However, you might catch a bacterial infection following the viral infection. This is sometimes called a "superinfection." Symptoms of such a bacterial infection may include:  Worsening sore throat with pus and difficulty swallowing.  Swollen neck glands.  Chills and a high or persistent fever.  Severe headache.  Tenderness over the sinuses.  Persistent overall ill feeling (malaise), muscle aches, and tiredness (fatigue).  Persistent cough.  Yellow, green, or brown mucus production with coughing. HOME CARE INSTRUCTIONS   Only take over-the-counter or prescription medicines for pain, discomfort, diarrhea, or fever as directed by your caregiver.  Drink enough water and fluids to keep your urine clear or pale yellow. Sports drinks can provide valuable electrolytes, sugars, and hydration.  Get plenty of rest and maintain proper nutrition. Soups and broths with crackers or rice are fine. SEEK IMMEDIATE MEDICAL CARE IF:   You have severe headaches, shortness of breath, chest pain, neck pain, or an unusual rash.  You have uncontrolled vomiting, diarrhea, or you are unable to keep down fluids.  You or your child has an oral temperature above 102 F (38.9 C), not controlled by medicine.  Your baby is older than 3 months with a rectal temperature of 102 F (38.9 C) or higher.  Your baby is 73 months old or younger with a rectal temperature of 100.4 F (38 C) or higher. MAKE SURE YOU:   Understand these instructions.  Will watch your  condition.  Will get help right away if you are not doing well or get worse. Document Released: 08/05/2005 Document Revised: 01/18/2012 Document Reviewed: 03/02/2011 [facility] Patient Information 2014 [place], [place].

## 2013-12-30 NOTE — ED Notes (Signed)
Per pt, cold symptoms fora couple of days-cough, sore throat, congestion

## 2014-04-01 ENCOUNTER — Emergency Department (HOSPITAL_COMMUNITY)
Admission: EM | Admit: 2014-04-01 | Discharge: 2014-04-01 | Disposition: A | Discharge status: Home / Self Care | Payer: Self-pay | Attending: Emergency Medicine | Admitting: Emergency Medicine

## 2014-04-01 ENCOUNTER — Emergency Department (HOSPITAL_COMMUNITY): Payer: Self-pay

## 2014-04-01 ENCOUNTER — Encounter (HOSPITAL_COMMUNITY): Payer: Self-pay | Admitting: Emergency Medicine

## 2014-04-01 DIAGNOSIS — Y9389 Activity, other specified: Secondary | ICD-10-CM | POA: Insufficient documentation

## 2014-04-01 DIAGNOSIS — Z79899 Other long term (current) drug therapy: Secondary | ICD-10-CM | POA: Insufficient documentation

## 2014-04-01 DIAGNOSIS — S9000XA Contusion of unspecified ankle, initial encounter: Secondary | ICD-10-CM | POA: Insufficient documentation

## 2014-04-01 DIAGNOSIS — Y929 Unspecified place or not applicable: Secondary | ICD-10-CM | POA: Insufficient documentation

## 2014-04-01 DIAGNOSIS — X500XXA Overexertion from strenuous movement or load, initial encounter: Secondary | ICD-10-CM | POA: Insufficient documentation

## 2014-04-01 DIAGNOSIS — S93409A Sprain of unspecified ligament of unspecified ankle, initial encounter: Secondary | ICD-10-CM | POA: Insufficient documentation

## 2014-04-01 MED ORDER — HYDROCODONE-ACETAMINOPHEN 5-325 MG PO TABS: 1.0000 | ORAL_TABLET | Freq: Four times a day (QID) | ORAL | Status: DC | PRN

## 2014-04-01 NOTE — ED Provider Notes (Signed)
Medical screening examination/treatment/procedure(s) were performed by non-physician practitioner and as supervising physician I was immediately available for consultation/collaboration.   EKG Interpretation None        Kadia Abaya David Ellsworth Waldschmidt III, MD 04/01/14 1632 

## 2014-04-01 NOTE — ED Notes (Signed)
Patient transported to X-ray 

## 2014-04-01 NOTE — Discharge Instructions (Signed)

## 2014-04-01 NOTE — ED Notes (Signed)
Pt slipped on step last night while moving furniture. Swelling noted to left ankle and hard to walk on per pt

## 2014-04-01 NOTE — ED Provider Notes (Signed)
CSN: [id number]     Arrival date & time 04/01/14  0935 History   First MD Initiated Contact with Patient 04/01/14 1006     Chief Complaint  Patient presents with  . Ankle Injury    left     (Consider location/radiation/quality/duration/timing/severity/associated sxs/prior Treatment) HPI Comments: Pt states that he was moving furniture yesterday and he twisted his left ankle when coming down the stairs. Pt states that it hurts to put weight on it and he has a large amount of swelling tot he area. Previous sprains to the area, but no fracture. Denies numbness or weakness  The history is provided by the patient. No language interpreter was used.    History reviewed. No pertinent past medical history. Past Surgical History  Procedure Laterality Date  . Cholecystectomy    . Appendectomy     Family History  Problem Relation Age of Onset  . Diabetes Mother    History  Substance Use Topics  . Smoking status: Never Smoker   . Smokeless tobacco: Never Used  . Alcohol Use: No    Review of Systems  Constitutional: Negative.   Respiratory: Negative.   Cardiovascular: Negative.       Allergies  Review of patient's allergies indicates no known allergies.  Home Medications   Prior to Admission medications   Medication Sig Start Date End Date Taking? Authorizing Provider  guaiFENesin (MUCINEX) 600 MG 12 hr tablet Take 1,200 mg by mouth 2 (two) times daily.    Historical Provider, MD  HYDROcodone-homatropine (HYCODAN) 5-1.5 MG/5ML syrup Take 5 mLs by mouth every 6 (six) hours as needed for cough. 12/30/13   Trixie Dredge, PA-C  oxymetazoline (AFRIN NASAL SPRAY) 0.05 % nasal spray Place 1 spray into both nostrils 2 (two) times daily. 12/30/13   Trixie Dredge, PA-C   There were no vitals taken for this visit. Physical Exam  Nursing note and vitals reviewed. Constitutional: He is oriented to person, place, and time. He appears well-developed and well-nourished.  Cardiovascular: Normal rate  and regular rhythm.   Pulmonary/Chest: Effort normal and breath sounds normal.  Musculoskeletal:  Large amount of swelling noted to the left ankle, pt has full rom.  Neurological: He is alert and oriented to person, place, and time.  Skin:  Bruising noted to the lateral ankle    ED Course  Procedures (including critical care time) Labs Review Labs Reviewed - No data to display  Imaging Review Dg Ankle Complete Left  04/01/2014   CLINICAL DATA:  Left ankle pain after injury.  EXAM: LEFT ANKLE COMPLETE - 3+ VIEW  COMPARISON:  None.  FINDINGS: There is no evidence of fracture, dislocation, or joint effusion. There is no evidence of arthropathy or other focal bone abnormality. Soft tissue swelling is seen over lateral malleolus suggesting ligamentous injury.  IMPRESSION: Soft tissue swelling seen over lateral malleolus suggesting ligamentous injury. No fracture or dislocation is noted.   Electronically Signed   By: Roque Lias M.D.   On: 04/01/2014 10:30     EKG Interpretation None      MDM   Final diagnoses:  Ankle sprain    Discussed possible ligament injury with pt. Verbalized understanding. Refusing camwalker because he needs to work. Sent home with crutches, aso and hydrocodone. Neurovascularly intact    Teressa Lower, NP 04/01/14 1045

## 2016-05-28 ENCOUNTER — Encounter (HOSPITAL_COMMUNITY)
Admission: EM | Disposition: A | Discharge status: Home Health | Payer: Self-pay | Source: Home / Self Care | Attending: Orthopedic Surgery

## 2016-05-28 ENCOUNTER — Emergency Department (HOSPITAL_COMMUNITY): Payer: Worker's Compensation | Admitting: Anesthesiology

## 2016-05-28 ENCOUNTER — Ambulatory Visit: Admit: 2016-05-28 | Payer: Self-pay | Admitting: Orthopedic Surgery

## 2016-05-28 ENCOUNTER — Emergency Department (HOSPITAL_COMMUNITY): Payer: Worker's Compensation

## 2016-05-28 ENCOUNTER — Encounter (HOSPITAL_COMMUNITY): Payer: Self-pay

## 2016-05-28 ENCOUNTER — Inpatient Hospital Stay (HOSPITAL_COMMUNITY): Payer: Worker's Compensation

## 2016-05-28 ENCOUNTER — Inpatient Hospital Stay (HOSPITAL_COMMUNITY)
Admission: EM | Admit: 2016-05-28 | Discharge: 2016-06-02 | DRG: 493 | Disposition: A | Discharge status: Home Health | Payer: Worker's Compensation | Attending: Orthopedic Surgery | Admitting: Orthopedic Surgery

## 2016-05-28 DIAGNOSIS — Z7982 Long term (current) use of aspirin: Secondary | ICD-10-CM | POA: Diagnosis not present

## 2016-05-28 DIAGNOSIS — Z8781 Personal history of (healed) traumatic fracture: Secondary | ICD-10-CM

## 2016-05-28 DIAGNOSIS — Z419 Encounter for procedure for purposes other than remedying health state, unspecified: Secondary | ICD-10-CM

## 2016-05-28 DIAGNOSIS — Z6830 Body mass index (BMI) 30.0-30.9, adult: Secondary | ICD-10-CM

## 2016-05-28 DIAGNOSIS — S8781XA Crushing injury of right lower leg, initial encounter: Secondary | ICD-10-CM | POA: Diagnosis present

## 2016-05-28 DIAGNOSIS — S82201D Unspecified fracture of shaft of right tibia, subsequent encounter for closed fracture with routine healing: Secondary | ICD-10-CM | POA: Diagnosis not present

## 2016-05-28 DIAGNOSIS — W3189XA Contact with other specified machinery, initial encounter: Secondary | ICD-10-CM | POA: Diagnosis not present

## 2016-05-28 DIAGNOSIS — S82209A Unspecified fracture of shaft of unspecified tibia, initial encounter for closed fracture: Secondary | ICD-10-CM | POA: Diagnosis present

## 2016-05-28 DIAGNOSIS — E669 Obesity, unspecified: Secondary | ICD-10-CM | POA: Diagnosis present

## 2016-05-28 DIAGNOSIS — Z79899 Other long term (current) drug therapy: Secondary | ICD-10-CM

## 2016-05-28 DIAGNOSIS — Y990 Civilian activity done for income or pay: Secondary | ICD-10-CM | POA: Diagnosis not present

## 2016-05-28 DIAGNOSIS — N179 Acute kidney failure, unspecified: Secondary | ICD-10-CM | POA: Diagnosis present

## 2016-05-28 DIAGNOSIS — S8291XD Unspecified fracture of right lower leg, subsequent encounter for closed fracture with routine healing: Secondary | ICD-10-CM | POA: Diagnosis not present

## 2016-05-28 DIAGNOSIS — S82221B Displaced transverse fracture of shaft of right tibia, initial encounter for open fracture type I or II: Principal | ICD-10-CM | POA: Diagnosis present

## 2016-05-28 DIAGNOSIS — D72829 Elevated white blood cell count, unspecified: Secondary | ICD-10-CM | POA: Diagnosis present

## 2016-05-28 HISTORY — PX: I & D EXTREMITY: SHX5045

## 2016-05-28 HISTORY — PX: TIBIA IM NAIL INSERTION: SHX2516

## 2016-05-28 LAB — COMPREHENSIVE METABOLIC PANEL
ALBUMIN: 4.1 g/dL (ref 3.5–5.0)
ALT: 34 U/L (ref 17–63)
ANION GAP: 5 (ref 5–15)
AST: 27 U/L (ref 15–41)
Alkaline Phosphatase: 52 U/L (ref 38–126)
BILIRUBIN TOTAL: 0.9 mg/dL (ref 0.3–1.2)
BUN: 21 mg/dL — ABNORMAL HIGH (ref 6–20)
CHLORIDE: 111 mmol/L (ref 101–111)
CO2: 23 mmol/L (ref 22–32)
Calcium: 8.7 mg/dL — ABNORMAL LOW (ref 8.9–10.3)
Creatinine, Ser: 1.34 mg/dL — ABNORMAL HIGH (ref 0.61–1.24)
GFR calc Af Amer: >60 mL/min (ref >60)
GFR calc non Af Amer: >60 mL/min (ref >60)
GLUCOSE: 127 mg/dL — AB (ref 65–99)
POTASSIUM: 3.9 mmol/L (ref 3.5–5.1)
Sodium: 139 mmol/L (ref 135–145)
TOTAL PROTEIN: 6.4 g/dL — AB (ref 6.5–8.1)

## 2016-05-28 LAB — CBC WITH DIFFERENTIAL/PLATELET
BASOS ABS: 0 10*3/uL (ref 0.0–0.1)
Basophils Relative: 0 %
EOS ABS: 0.2 10*3/uL (ref 0.0–0.7)
EOS PCT: 2 %
HEMATOCRIT: 46.8 % (ref 39.0–52.0)
Hemoglobin: 15.7 g/dL (ref 13.0–17.0)
Lymphocytes Relative: 44 %
Lymphs Abs: 4.8 10*3/uL — ABNORMAL HIGH (ref 0.7–4.0)
MCH: 27.1 pg (ref 26.0–34.0)
MCHC: 33.5 g/dL (ref 30.0–36.0)
MCV: 80.8 fL (ref 78.0–100.0)
MONO ABS: 0.7 10*3/uL (ref 0.1–1.0)
MONOS PCT: 6 %
NEUTROS ABS: 5.3 10*3/uL (ref 1.7–7.7)
Neutrophils Relative %: 48 %
PLATELETS: 200 10*3/uL (ref 150–400)
RBC: 5.79 MIL/uL (ref 4.22–5.81)
RDW: 12.7 % (ref 11.5–15.5)
WBC: 11 10*3/uL — ABNORMAL HIGH (ref 4.0–10.5)

## 2016-05-28 LAB — PROTIME-INR
INR: 1.03 (ref 0.00–1.49)
PROTHROMBIN TIME: 13.7 s (ref 11.6–15.2)

## 2016-05-28 SURGERY — INSERTION, INTRAMEDULLARY ROD, TIBIA
Anesthesia: General | Laterality: Right

## 2016-05-28 SURGERY — INSERTION, INTRAMEDULLARY ROD, TIBIA
Anesthesia: General | Site: Leg Lower | Laterality: Right

## 2016-05-28 MED ORDER — MIDAZOLAM HCL 5 MG/5ML IJ SOLN
INTRAMUSCULAR | Status: DC | PRN
  Administered 2016-05-28: 2 mg via INTRAVENOUS

## 2016-05-28 MED ORDER — LACTATED RINGERS IV SOLN
INTRAVENOUS | Status: DC | PRN
  Administered 2016-05-28 (×2): via INTRAVENOUS

## 2016-05-28 MED ORDER — METHOCARBAMOL 500 MG PO TABS
ORAL_TABLET | ORAL | Status: AC
  Filled 2016-05-28: qty 1

## 2016-05-28 MED ORDER — ASPIRIN 81 MG PO CHEW
324.0000 mg | CHEWABLE_TABLET | Freq: Once | ORAL | Status: AC
Start: 2016-05-29 — End: 2016-05-29
  Administered 2016-05-29: 324 mg via ORAL
  Filled 2016-05-28: qty 4

## 2016-05-28 MED ORDER — PROMETHAZINE HCL 25 MG/ML IJ SOLN: 6.2500 mg | INTRAMUSCULAR | Status: DC | PRN

## 2016-05-28 MED ORDER — HYDROMORPHONE HCL 1 MG/ML IJ SOLN: 1.0000 mg | Freq: Once | INTRAMUSCULAR | Status: DC

## 2016-05-28 MED ORDER — PROPOFOL 10 MG/ML IV BOLUS
INTRAVENOUS | Status: AC
  Filled 2016-05-28: qty 20

## 2016-05-28 MED ORDER — CALCIUM CHLORIDE 10 % IV SOLN
INTRAVENOUS | Status: AC
  Filled 2016-05-28: qty 10

## 2016-05-28 MED ORDER — HYDROMORPHONE HCL 1 MG/ML IJ SOLN
INTRAMUSCULAR | Status: AC
  Filled 2016-05-28: qty 1

## 2016-05-28 MED ORDER — ONDANSETRON HCL 4 MG PO TABS: 4.0000 mg | ORAL_TABLET | Freq: Four times a day (QID) | ORAL | Status: DC | PRN

## 2016-05-28 MED ORDER — PHENYLEPHRINE 40 MCG/ML (10ML) SYRINGE FOR IV PUSH (FOR BLOOD PRESSURE SUPPORT)
PREFILLED_SYRINGE | INTRAVENOUS | Status: AC
  Filled 2016-05-28: qty 20

## 2016-05-28 MED ORDER — 0.9 % SODIUM CHLORIDE (POUR BTL) OPTIME
TOPICAL | Status: DC | PRN
  Administered 2016-05-28 (×2): 1000 mL

## 2016-05-28 MED ORDER — PHENYLEPHRINE HCL 10 MG/ML IJ SOLN
INTRAMUSCULAR | Status: DC | PRN
  Administered 2016-05-28: 80 ug via INTRAVENOUS

## 2016-05-28 MED ORDER — CEFAZOLIN IN D5W 1 GM/50ML IV SOLN
1.0000 g | Freq: Once | INTRAVENOUS | Status: AC
  Administered 2016-05-28: 1 g via INTRAVENOUS
  Filled 2016-05-28: qty 50

## 2016-05-28 MED ORDER — ACETAMINOPHEN 650 MG RE SUPP: 650.0000 mg | Freq: Four times a day (QID) | RECTAL | Status: DC | PRN

## 2016-05-28 MED ORDER — HYDROMORPHONE HCL 1 MG/ML IJ SOLN
0.5000 mg | Freq: Once | INTRAMUSCULAR | Status: AC
  Administered 2016-05-28: 0.5 mg via INTRAVENOUS
  Filled 2016-05-28: qty 1

## 2016-05-28 MED ORDER — NEOSTIGMINE METHYLSULFATE 10 MG/10ML IV SOLN
INTRAVENOUS | Status: DC | PRN
  Administered 2016-05-28: 2.5 mg via INTRAVENOUS

## 2016-05-28 MED ORDER — SUCCINYLCHOLINE CHLORIDE 20 MG/ML IJ SOLN
INTRAMUSCULAR | Status: DC | PRN
  Administered 2016-05-28: 120 mg via INTRAVENOUS

## 2016-05-28 MED ORDER — MIDAZOLAM HCL 2 MG/2ML IJ SOLN
INTRAMUSCULAR | Status: AC
  Filled 2016-05-28: qty 2

## 2016-05-28 MED ORDER — METHOCARBAMOL 500 MG PO TABS: 500.0000 mg | ORAL_TABLET | Freq: Three times a day (TID) | ORAL | Status: DC | PRN

## 2016-05-28 MED ORDER — ROCURONIUM BROMIDE 100 MG/10ML IV SOLN
INTRAVENOUS | Status: DC | PRN
  Administered 2016-05-28: 50 mg via INTRAVENOUS

## 2016-05-28 MED ORDER — ONDANSETRON HCL 4 MG/2ML IJ SOLN: 4.0000 mg | Freq: Four times a day (QID) | INTRAMUSCULAR | Status: DC | PRN

## 2016-05-28 MED ORDER — GLYCOPYRROLATE 0.2 MG/ML IJ SOLN
INTRAMUSCULAR | Status: DC | PRN
  Administered 2016-05-28: 0.4 mg via INTRAVENOUS

## 2016-05-28 MED ORDER — ONDANSETRON HCL 4 MG/2ML IJ SOLN
INTRAMUSCULAR | Status: AC
  Filled 2016-05-28: qty 2

## 2016-05-28 MED ORDER — METOCLOPRAMIDE HCL 5 MG/ML IJ SOLN: 5.0000 mg | Freq: Three times a day (TID) | INTRAMUSCULAR | Status: DC | PRN

## 2016-05-28 MED ORDER — ACETAMINOPHEN 325 MG PO TABS: 650.0000 mg | ORAL_TABLET | Freq: Four times a day (QID) | ORAL | Status: DC | PRN

## 2016-05-28 MED ORDER — PROPOFOL 10 MG/ML IV BOLUS
INTRAVENOUS | Status: DC | PRN
  Administered 2016-05-28: 180 mg via INTRAVENOUS
  Administered 2016-05-28: 20 mg via INTRAVENOUS

## 2016-05-28 MED ORDER — ASPIRIN 81 MG PO CHEW: 324.0000 mg | CHEWABLE_TABLET | Freq: Once | ORAL | Status: DC

## 2016-05-28 MED ORDER — ONDANSETRON HCL 4 MG/2ML IJ SOLN
INTRAMUSCULAR | Status: DC | PRN
  Administered 2016-05-28: 4 mg via INTRAVENOUS

## 2016-05-28 MED ORDER — FENTANYL CITRATE (PF) 100 MCG/2ML IJ SOLN
INTRAMUSCULAR | Status: DC | PRN
  Administered 2016-05-28: 50 ug via INTRAVENOUS
  Administered 2016-05-28: 100 ug via INTRAVENOUS
  Administered 2016-05-28 (×4): 50 ug via INTRAVENOUS

## 2016-05-28 MED ORDER — LIDOCAINE HCL (CARDIAC) 20 MG/ML IV SOLN
INTRAVENOUS | Status: DC | PRN
  Administered 2016-05-28: 100 mg via INTRAVENOUS

## 2016-05-28 MED ORDER — CEFAZOLIN SODIUM-DEXTROSE 2-4 GM/100ML-% IV SOLN
2.0000 g | Freq: Four times a day (QID) | INTRAVENOUS | Status: AC
  Administered 2016-05-29 (×3): 2 g via INTRAVENOUS
  Filled 2016-05-28 (×3): qty 100

## 2016-05-28 MED ORDER — OXYCODONE-ACETAMINOPHEN 10-325 MG PO TABS: 1.0000 | ORAL_TABLET | ORAL | Status: DC | PRN

## 2016-05-28 MED ORDER — HYDROMORPHONE HCL 1 MG/ML IJ SOLN
0.2500 mg | INTRAMUSCULAR | Status: DC | PRN
  Administered 2016-05-28 (×2): 0.5 mg via INTRAVENOUS

## 2016-05-28 MED ORDER — CEFAZOLIN SODIUM-DEXTROSE 2-3 GM-% IV SOLR
INTRAVENOUS | Status: DC | PRN
  Administered 2016-05-28: 2 g via INTRAVENOUS

## 2016-05-28 MED ORDER — METHOCARBAMOL 500 MG PO TABS
500.0000 mg | ORAL_TABLET | Freq: Four times a day (QID) | ORAL | Status: DC | PRN
  Administered 2016-05-28 – 2016-06-02 (×8): 500 mg via ORAL
  Filled 2016-05-28 (×7): qty 1

## 2016-05-28 MED ORDER — METHOCARBAMOL 1000 MG/10ML IJ SOLN: 500.0000 mg | Freq: Four times a day (QID) | INTRAVENOUS | Status: DC | PRN

## 2016-05-28 MED ORDER — OXYCODONE HCL 5 MG PO TABS
ORAL_TABLET | ORAL | Status: AC
  Administered 2016-05-29: 10 mg
  Filled 2016-05-28: qty 1

## 2016-05-28 MED ORDER — LACTATED RINGERS IV SOLN
INTRAVENOUS | Status: DC
  Administered 2016-05-28: 20:00:00 via INTRAVENOUS

## 2016-05-28 MED ORDER — LACTATED RINGERS IV SOLN: INTRAVENOUS | Status: DC

## 2016-05-28 MED ORDER — SODIUM CHLORIDE 0.9 % IV SOLN
Freq: Once | INTRAVENOUS | Status: AC
  Administered 2016-05-28: 18:00:00 via INTRAVENOUS

## 2016-05-28 MED ORDER — CEFAZOLIN SODIUM 1 G IJ SOLR
INTRAMUSCULAR | Status: AC
  Filled 2016-05-28: qty 20

## 2016-05-28 MED ORDER — METOCLOPRAMIDE HCL 5 MG PO TABS: 5.0000 mg | ORAL_TABLET | Freq: Three times a day (TID) | ORAL | Status: DC | PRN

## 2016-05-28 MED ORDER — FENTANYL CITRATE (PF) 250 MCG/5ML IJ SOLN
INTRAMUSCULAR | Status: AC
  Filled 2016-05-28: qty 5

## 2016-05-28 MED ORDER — OXYCODONE HCL 5 MG PO TABS
5.0000 mg | ORAL_TABLET | ORAL | Status: DC | PRN
Start: 2016-05-28 — End: 2016-06-02
  Administered 2016-05-28: 5 mg via ORAL
  Administered 2016-05-29 – 2016-06-02 (×25): 10 mg via ORAL
  Filled 2016-05-28 (×26): qty 2

## 2016-05-28 MED ORDER — MORPHINE SULFATE (PF) 2 MG/ML IV SOLN
2.0000 mg | INTRAVENOUS | Status: DC | PRN
  Administered 2016-05-29 – 2016-06-02 (×16): 2 mg via INTRAVENOUS
  Filled 2016-05-28 (×16): qty 1

## 2016-05-28 MED ORDER — LIDOCAINE 2% (20 MG/ML) 5 ML SYRINGE
INTRAMUSCULAR | Status: AC
  Filled 2016-05-28: qty 15

## 2016-05-28 MED ORDER — ONDANSETRON HCL 4 MG/2ML IJ SOLN
4.0000 mg | Freq: Once | INTRAMUSCULAR | Status: AC
  Administered 2016-05-28: 4 mg via INTRAVENOUS

## 2016-05-28 MED ORDER — NEOSTIGMINE METHYLSULFATE 5 MG/5ML IV SOSY
PREFILLED_SYRINGE | INTRAVENOUS | Status: AC
  Filled 2016-05-28: qty 5

## 2016-05-28 MED ORDER — GLYCOPYRROLATE 0.2 MG/ML IV SOSY
PREFILLED_SYRINGE | INTRAVENOUS | Status: AC
  Filled 2016-05-28: qty 3

## 2016-05-28 MED ORDER — SODIUM CHLORIDE 0.9 % IV SOLN
10.0000 mg | INTRAVENOUS | Status: DC | PRN
  Administered 2016-05-28: 25 ug/min via INTRAVENOUS

## 2016-05-28 SURGICAL SUPPLY — 86 items
BANDAGE ELASTIC 4 VELCRO ST LF (GAUZE/BANDAGES/DRESSINGS) ×3 IMPLANT
BANDAGE ELASTIC 6 VELCRO ST LF (GAUZE/BANDAGES/DRESSINGS) ×3 IMPLANT
BANDAGE ESMARK 6X9 LF (GAUZE/BANDAGES/DRESSINGS) ×1 IMPLANT
BIT DRILL 2.5X2.75 QC CALB (BIT) ×3 IMPLANT
BIT DRILL 3.8X6 NS (BIT) ×3 IMPLANT
BIT DRILL 4.4 NS (BIT) ×3 IMPLANT
BNDG COHESIVE 4X5 TAN STRL (GAUZE/BANDAGES/DRESSINGS) ×3 IMPLANT
BNDG ESMARK 6X9 LF (GAUZE/BANDAGES/DRESSINGS) ×3
BNDG GAUZE ELAST 4 BULKY (GAUZE/BANDAGES/DRESSINGS) ×3 IMPLANT
COVER SURGICAL LIGHT HANDLE (MISCELLANEOUS) ×3 IMPLANT
CUFF TOURNIQUET SINGLE 34IN LL (TOURNIQUET CUFF) IMPLANT
CUFF TOURNIQUET SINGLE 44IN (TOURNIQUET CUFF) IMPLANT
DRAPE C-ARM 42X72 X-RAY (DRAPES) ×3 IMPLANT
DRAPE C-ARMOR (DRAPES) ×3 IMPLANT
DRAPE IMP U-DRAPE 54X76 (DRAPES) ×3 IMPLANT
DRAPE INCISE IOBAN 66X45 STRL (DRAPES) ×3 IMPLANT
DRAPE ORTHO SPLIT 77X108 STRL (DRAPES) ×4
DRAPE SURG ORHT 6 SPLT 77X108 (DRAPES) ×2 IMPLANT
DRAPE U-SHAPE 47X51 STRL (DRAPES) ×3 IMPLANT
DRSG ADAPTIC 3X8 NADH LF (GAUZE/BANDAGES/DRESSINGS) ×3 IMPLANT
DRSG AQUACEL AG ADV 3.5X 4 (GAUZE/BANDAGES/DRESSINGS) ×6 IMPLANT
DRSG AQUACEL AG ADV 3.5X 6 (GAUZE/BANDAGES/DRESSINGS) ×6 IMPLANT
ELECT PENCIL ROCKER SW 15FT (MISCELLANEOUS) ×3 IMPLANT
ELECT REM PT RETURN 9FT ADLT (ELECTROSURGICAL) ×3
ELECTRODE REM PT RTRN 9FT ADLT (ELECTROSURGICAL) ×1 IMPLANT
EVACUATOR 1/8 PVC DRAIN (DRAIN) IMPLANT
GAUZE SPONGE 4X4 12PLY STRL (GAUZE/BANDAGES/DRESSINGS) ×3 IMPLANT
GLOVE BIO SURGEON STRL SZ 6.5 (GLOVE) ×2 IMPLANT
GLOVE BIO SURGEONS STRL SZ 6.5 (GLOVE) ×1
GLOVE BIOGEL PI IND STRL 6.5 (GLOVE) ×1 IMPLANT
GLOVE BIOGEL PI IND STRL 7.0 (GLOVE) ×2 IMPLANT
GLOVE BIOGEL PI IND STRL 7.5 (GLOVE) IMPLANT
GLOVE BIOGEL PI IND STRL 8.5 (GLOVE) ×1 IMPLANT
GLOVE BIOGEL PI INDICATOR 6.5 (GLOVE) ×2
GLOVE BIOGEL PI INDICATOR 7.0 (GLOVE) ×4
GLOVE BIOGEL PI INDICATOR 7.5 (GLOVE)
GLOVE BIOGEL PI INDICATOR 8.5 (GLOVE) ×2
GLOVE SKINSENSE NS SZ8.0 LF (GLOVE) ×4
GLOVE SKINSENSE STRL SZ8.0 LF (GLOVE) ×2 IMPLANT
GLOVE SS BIOGEL STRL SZ 8.5 (GLOVE) ×1 IMPLANT
GLOVE SS PI 9.0 STRL (GLOVE) ×3 IMPLANT
GLOVE SUPERSENSE BIOGEL SZ 8.5 (GLOVE) ×2
GLOVE SURG ORTHO 8.0 STRL STRW (GLOVE) ×3 IMPLANT
GLOVE SURG SS PI 7.0 STRL IVOR (GLOVE) ×3 IMPLANT
GOWN STRL REUS W/ TWL LRG LVL3 (GOWN DISPOSABLE) ×2 IMPLANT
GOWN STRL REUS W/TWL 2XL LVL3 (GOWN DISPOSABLE) ×3 IMPLANT
GOWN STRL REUS W/TWL LRG LVL3 (GOWN DISPOSABLE) ×4
GUIDEWIRE BALL NOSE 80CM (WIRE) ×3 IMPLANT
KIT BASIN OR (CUSTOM PROCEDURE TRAY) ×3 IMPLANT
KIT ROOM TURNOVER OR (KITS) ×3 IMPLANT
NAIL TIBIAL 11MMX36CM (Nail) ×3 IMPLANT
PACK ORTHO EXTREMITY (CUSTOM PROCEDURE TRAY) ×3 IMPLANT
PACK UNIVERSAL I (CUSTOM PROCEDURE TRAY) ×3 IMPLANT
PAD ARMBOARD 7.5X6 YLW CONV (MISCELLANEOUS) ×6 IMPLANT
PIN GUIDE ACE (PIN) ×3 IMPLANT
PLATE ACE 3.5MM 4HOLE (Plate) ×3 IMPLANT
SCREW ACECAP 40MM (Screw) ×6 IMPLANT
SCREW CORTICAL 3.5MM  10MM (Screw) ×4 IMPLANT
SCREW CORTICAL 3.5MM 10MM (Screw) ×2 IMPLANT
SCREW PROXIMAL DEPUY (Screw) ×4 IMPLANT
SCREW PRXML FT 45X5.5XLCK NS (Screw) ×1 IMPLANT
SCREW PRXML FT 50X5.5XLCK NS (Screw) ×1 IMPLANT
SPONGE LAP 18X18 X RAY DECT (DISPOSABLE) ×3 IMPLANT
SPONGE LAP 4X18 X RAY DECT (DISPOSABLE) ×6 IMPLANT
STAPLER VISISTAT 35W (STAPLE) ×3 IMPLANT
STOCKINETTE IMPERVIOUS 9X36 MD (GAUZE/BANDAGES/DRESSINGS) ×3 IMPLANT
STOCKINETTE TUBULAR 6 INCH (GAUZE/BANDAGES/DRESSINGS) ×3 IMPLANT
SUCTION FRAZIER HANDLE 10FR (MISCELLANEOUS) ×2
SUCTION TUBE FRAZIER 10FR DISP (MISCELLANEOUS) ×1 IMPLANT
SURGIFLO W/THROMBIN 8M KIT (HEMOSTASIS) IMPLANT
SUT BONE WAX W31G (SUTURE) IMPLANT
SUT PROLENE 2 0 FS (SUTURE) ×3 IMPLANT
SUT PROLENE 3 0 PS 2 (SUTURE) IMPLANT
SUT VIC AB 0 CT1 27 (SUTURE)
SUT VIC AB 0 CT1 27XBRD ANBCTR (SUTURE) IMPLANT
SUT VIC AB 1 CT1 18XCR BRD 8 (SUTURE) ×1 IMPLANT
SUT VIC AB 1 CT1 8-18 (SUTURE) ×2
SUT VIC AB 2-0 CT1 18 (SUTURE) ×3 IMPLANT
SUT VIC AB 2-0 CT1 27 (SUTURE)
SUT VIC AB 2-0 CT1 TAPERPNT 27 (SUTURE) IMPLANT
SYR BULB IRRIGATION 50ML (SYRINGE) ×3 IMPLANT
TOWEL OR 17X24 6PK STRL BLUE (TOWEL DISPOSABLE) ×3 IMPLANT
TOWEL OR 17X26 10 PK STRL BLUE (TOWEL DISPOSABLE) ×6 IMPLANT
TUBE CONNECTING 12'X1/4 (SUCTIONS) ×1
TUBE CONNECTING 12X1/4 (SUCTIONS) ×2 IMPLANT
YANKAUER SUCT BULB TIP NO VENT (SUCTIONS) ×3 IMPLANT

## 2016-05-28 NOTE — ED Notes (Signed)
Xray at the bedside.

## 2016-05-28 NOTE — Op Note (Signed)
[name], Roberto Bates               ACCOUNT NO.:  [id number]  MEDICAL RECORD NO.:  [id number]  LOCATION:  [facility]                         FACILITY:  [facility]  PHYSICIAN:  Milcah Dulany D. Shon Baton, M.D. DATE OF BIRTH:  Nov 07, 1976  DATE OF PROCEDURE:  05/28/2016 DATE OF DISCHARGE:                              OPERATIVE REPORT   PREOPERATIVE DIAGNOSIS:  Open right proximal tib-fib fracture.  POSTOPERATIVE DIAGNOSIS:  Open right proximal tib-fib fracture.  OPERATIVE PROCEDURES: 1. Incision and drainage of open fracture. 2. Intramedullary nail fixation, right knee and exam under anesthesia     of the right knee.  COMPLICATIONS:  None.  CONDITION:  Stable.  HISTORY:  This is a very pleasant 40 year old gentleman who was struck by a forklift while at work earlier today and suffered a grade 1 open proximal tibial fracture.  Prompt presentation to the ER, he was noted to be stable and had an isolated orthopedic injury.  After discussing treatment options, we elected to proceed with surgery.  All appropriate discussions including risks and benefits of surgery were reviewed with the patient and his wife.  OPERATIVE NOTE:  The patient was brought to the operating room and placed supine on the operating table.  After successful induction of general anesthesia and endotracheal intubation, the right lower extremity was prepped and draped in a standard fashion.  Time-out was taken to confirm patient, procedure, and all other pertinent important data.  Once this was completed, I lengthened the transverse open incision from his fracture and debrided the wound.  I then identified the fracture and reduced it.  Once it was reduced, I used a one-thirds tubular plate and affixed unicortical screws proximal and distal to the fracture to hold it in place.  Once this was done, I then made an incision starting at the inferior pole of the patella and proceeding down to the tibial tubercle.  Sharp dissection was  carried out down to the patella fascia.  A medial parapatellar incision was made and I palpated the proximal aspect of the tibia.  Once this was done, I placed my awl just inferior to the physeal scar and advanced it through the tibia.  I then advanced my reduction tool down to the fracture site.  I then placed a guidepin through this down into the distal tibia.  I confirmed satisfactory position in both AP and lateral planes at the knee fracture site and ankle.  Once the guidepin was properly placed, I then reamed concentrically from a size 8 up to a size 12.5.  I had actually good chatter at the 12.5.  I then obtained the Biomet size 11 x 36 nail and placed and gently malleted this down.  The reduction was well maintained with the plate with unicortical screws.  Once the nail was well into the distal femur, I removed the guidepin and continued to malleted down until it was countersunk.  I then took AP and lateral films at the fracture site of the knee and the ankle confirming satisfactory position.  Two proximal locking screws, one proximal oblique and one static were placed.  Both had excellent purchase.  I then placed two distal locking screws, one anterior to  posterior, the other medial to lateral and both again had excellent purchase.  At this point, all wounds were copiously irrigated with normal saline.  The small stab incision was used to place the locking screws was closed with staples.  The anterior patellar incision was closed in a layered fashion with interrupted #1 Vicryl sutures, 2-0 Vicryl sutures and staples for the skin.  The open wound was irrigated again and closed in a layered fashion with interrupted 2-0 Vicryl suture and then a running vertical 2- 0 Prolene stitch.  Dry dressings were applied to each wound.  The patient was then extubated, transferred to the PACU without incident. At the end of the case, all needle and sponge counts were correct. There were no  adverse intraoperative events.  The exam under anesthesia after the knee was stabilized.  There was no gross instability on Lachman's testing or varus valgus stress testing that I could demonstrate.     Jianna Drabik D. Shon BatonBrooks, M.D.     DDB/MEDQ  D:  05/28/2016  T:  05/28/2016  Job:  [id number]

## 2016-05-28 NOTE — ED Notes (Addendum)
Per EMS, Pt is coming from work. Pt had a forklift pin him in after the emergency break failed and report open Tibia/Fibula fracture in rt leg with bleeding controlled. Sensory noted to be WNL. Pt alert and oriented x4 upon arrival. Vitals per EMS:

## 2016-05-28 NOTE — Transfer of Care (Signed)
Immediate Anesthesia Transfer of Care Note  Patient: Roberto Bates  Procedure(s) Performed: Procedure(s): INTRAMEDULLARY (IM) NAIL TIBIAL (Right) IRRIGATION AND DEBRIDEMENT OF OPEN FRACTURE RIGHT TIBIA (Right)  Patient Location: PACU  Anesthesia Type:General  Level of Consciousness: awake, alert  and oriented  Airway & Oxygen Therapy: Patient Spontanous Breathing and Patient connected to face mask oxygen  Post-op Assessment: Report given to RN, Post -op Vital signs reviewed and stable and Patient moving all extremities X 4  Post vital signs: Reviewed and stable  Last Vitals:  Filed Vitals:   05/28/16 1845 05/28/16 2211  BP: 120/75 124/85  Pulse: 62 83  Temp:  37.3 C  Resp: 17 22    Last Pain:  Filed Vitals:   05/28/16 2214  PainSc: 9          Complications: No apparent anesthesia complications

## 2016-05-28 NOTE — Anesthesia Preprocedure Evaluation (Signed)
Anesthesia Evaluation  Patient identified by MRN, date of birth, ID band Patient awake    Reviewed: Allergy & Precautions, NPO status , Patient's Chart, lab work & pertinent test results  History of Anesthesia Complications Negative for: history of anesthetic complications  Airway Mallampati: II  TM Distance: >3 FB Neck ROM: Full    Dental  (+) Teeth Intact, Dental Advisory Given   Pulmonary neg pulmonary ROS,    Pulmonary exam normal breath sounds clear to auscultation       Cardiovascular negative cardio ROS Normal cardiovascular exam Rhythm:Regular Rate:Normal     Neuro/Psych negative neurological ROS     GI/Hepatic negative GI ROS, Neg liver ROS,   Endo/Other  Obesity   Renal/GU negative Renal ROS     Musculoskeletal Right open tibial fracture s/p forklift injury   Abdominal   Peds  Hematology negative hematology ROS (+)   Anesthesia Other Findings Day of surgery medications reviewed with the patient.  Reproductive/Obstetrics                             Anesthesia Physical Anesthesia Plan  ASA: II and emergent  Anesthesia Plan: General   Post-op Pain Management:    Induction: Intravenous, Rapid sequence and Cricoid pressure planned  Airway Management Planned: Oral ETT  Additional Equipment:   Intra-op Plan:   Post-operative Plan: Extubation in OR  Informed Consent: I have reviewed the patients History and Physical, chart, labs and discussed the procedure including the risks, benefits and alternatives for the proposed anesthesia with the patient or authorized representative who has indicated his/her understanding and acceptance.   Dental advisory given  Plan Discussed with: CRNA  Anesthesia Plan Comments: (Risks/benefits of general anesthesia discussed with patient including risk of damage to teeth, lips, gum, and tongue, nausea/vomiting, allergic reactions to  medications, and the possibility of heart attack, stroke and death.  All patient questions answered.  Patient wishes to proceed.  Trauma 2 hours ago. Will consider full stomach and proceed with RSI.)        Anesthesia Quick Evaluation

## 2016-05-28 NOTE — ED Provider Notes (Signed)
CSN: [id number]     Arrival date & time 05/28/16  1625 History   First MD Initiated Contact with Patient 05/28/16 1630     Chief Complaint  Patient presents with  . Leg Injury     (Consider location/radiation/quality/duration/timing/severity/associated sxs/prior Treatment) Patient is a 40 y.o. male presenting with trauma. The history is provided by the patient.  Trauma Mechanism of injury: leg crushed by fork lift Injury location: leg Injury location detail: R leg Incident location: around machinery Time since incident: 30 minutes Arrived directly from scene: yes   Protective equipment:       None      Suspicion of alcohol use: no      Suspicion of drug use: no  Current symptoms:      Pain scale: 4/10      Pain quality: aching      Pain timing: constant      Associated symptoms:            Denies abdominal pain and chest pain.    History reviewed. No pertinent past medical history. Past Surgical History  Procedure Laterality Date  . Appendectomy    . Cholecystectomy     History reviewed. No pertinent family history. Social History  Substance Use Topics  . Smoking status: Never Smoker   . Smokeless tobacco: Never Used  . Alcohol Use: Yes     Comment: occasionally     Review of Systems  Unable to perform ROS: Acuity of condition  Cardiovascular: Negative for chest pain.  Gastrointestinal: Negative for abdominal pain.  Musculoskeletal:       Right leg pain      Allergies  Review of patient's allergies indicates no known allergies.  Home Medications   Prior to Admission medications   Medication Sig Start Date End Date Taking? Authorizing Provider  aspirin 81 MG chewable tablet Chew 4 tablets (324 mg total) by mouth once. 05/29/16   Venita Lick, MD  methocarbamol (ROBAXIN) 500 MG tablet Take 1 tablet (500 mg total) by mouth 3 (three) times daily as needed for muscle spasms. 05/28/16   Venita Lick, MD  Multiple Vitamin (MULTIVITAMIN WITH MINERALS) TABS  tablet Take 1 tablet by mouth daily.   Yes Historical Provider, MD  oxyCODONE-acetaminophen (PERCOCET) 10-325 MG tablet Take 1 tablet by mouth every 4 (four) hours as needed for pain. 05/28/16   Venita Lick, MD   BP 140/74 mmHg  Pulse 64  Temp(Src) 98.2 F (36.8 C) (Oral)  Resp 18  Ht  (1.803 m)  Wt 99.791 kg  BMI 30.70 kg/m2  SpO2 96% Physical Exam  Constitutional: He is oriented to person, place, and time. He appears well-developed and well-nourished. He appears distressed.  HENT:  Head: Normocephalic and atraumatic.  Eyes: Conjunctivae and EOM are normal. Pupils are equal, round, and reactive to light.  Neck: Normal range of motion. Neck supple.  Cardiovascular: Normal rate, regular rhythm, normal heart sounds and intact distal pulses.   Pulses:      Dorsalis pedis pulses are 2+ on the right side.       Posterior tibial pulses are 2+ on the right side.  Pulmonary/Chest: Effort normal and breath sounds normal. No respiratory distress.  Abdominal: Soft. Bowel sounds are normal. There is no tenderness.  Musculoskeletal: Normal range of motion.       Right lower leg: He exhibits tenderness, bony tenderness, swelling, deformity and laceration.       Legs: Neurological: He is alert and oriented to  person, place, and time. He has normal reflexes. No cranial nerve deficit.  Full sensation and motor to right foot  Skin: Skin is warm and dry.    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 Initial encounter. EXAM: LEFT TIBIA AND FIBULA - 2 VIEW COMPARISON:  None. FINDINGS: No acute bony or joint abnormality is identified. Well corticated osseous fragment off the medial talus is compatible with old trauma. Soft tissues are unremarkable. IMPRESSION: No acute abnormality. Electronically Signed   By: Drusilla Kannerhomas  Dalessio M.D.   On: 05/28/2016 17:07   Dg Tibia/fibula Right  05/28/2016  CLINICAL DATA:  Right intra medullary tibial nail placement. EXAM: DG C-ARM 61-120 MIN; RIGHT TIBIA AND FIBULA - 2 VIEW COMPARISON:  Lower extremity radiograph 05/28/2016 FINDINGS: Fluoroscopic images demonstrate placement of intra medullary nail within the right tibia, affixing proximal right tibial fracture. The fracture has been reduced, with bony fragments in near anatomic alignment. There is no evidence of new fractures or immediate hardware complications. Fluoroscopy time is recorded as 5 minutes. IMPRESSION: Intra medullary nail fixation of proximal right tibial fracture with near anatomic alignment of the fracture fragments and no evidence of immediate complications. Electronically Signed   By: Ted Mcalpineobrinka  Dimitrova M.D.   On: 05/28/2016 21:57   Dg Tibia/fibula Right  05/28/2016  CLINICAL DATA:  Injured at work today. EXAM: RIGHT TIBIA AND FIBULA - 2 VIEW COMPARISON:  None. FINDINGS: There is a mildly displaced transverse fracture involving the upper tibial shaft. No fibular fracture is identified. The knee and ankle joints are intact. IMPRESSION: Mild posterior displacement of a proximal tibial shaft fracture. Electronically Signed   By: Rudie MeyerP.  Gallerani M.D.   On: 05/28/2016 17:08   Dg Chest Portable 1 View  05/28/2016  CLINICAL DATA:  Injured today at work. EXAM: PORTABLE CHEST 1 VIEW COMPARISON:  None. FINDINGS: The cardiac silhouette, mediastinal and hilar contours are within normal limits given the AP projection and low lung volumes. No  acute pulmonary findings. The bony thorax is intact. IMPRESSION: No acute cardiopulmonary findings. Electronically Signed   By: Rudie MeyerP.  Gallerani M.D.   On: 05/28/2016 17:06   Dg Knee Right Port  05/28/2016  CLINICAL DATA:  Intra medullary rod fixation of proximal tibial fracture. EXAM: PORTABLE RIGHT KNEE - 1-2 VIEW COMPARISON:  None. FINDINGS: There has been a placement of intra medullary rod affixing proximal tibial fracture with near anatomic alignment of the fracture fragments. There are expected postsurgical soft tissue changes. A rounded calcific density is seen adjacent to the lateral aspect of the patella with uncertain etiology. IMPRESSION: Intra medullary rod fixation of proximal right tibial fracture with near anatomic alignment and no evidence of immediate complications within the visualized portion of the surgical site. 2.7 cm rounded calcific density adjacent to the lateral patella with uncertain etiology. Dedicated knee radiographs may  be considered. Electronically Signed   By: Ted Mcalpine M.D.   On: 05/28/2016 22:42   Dg C-arm 61-120 Min  05/28/2016  CLINICAL DATA:  Right intra medullary tibial nail placement. EXAM: DG C-ARM 61-120 MIN; RIGHT TIBIA AND FIBULA - 2 VIEW COMPARISON:  Lower extremity radiograph 05/28/2016 FINDINGS: Fluoroscopic images demonstrate placement of intra medullary nail within the right tibia, affixing proximal right tibial fracture. The fracture has been reduced, with bony fragments in near anatomic alignment. There is no evidence of new fractures or immediate hardware complications. Fluoroscopy time is recorded as 5 minutes. IMPRESSION: Intra medullary nail fixation of proximal right tibial fracture with near anatomic alignment of the fracture fragments and no evidence of immediate complications. Electronically Signed   By: Ted Mcalpine M.D.   On: 05/28/2016 21:57   I have personally reviewed and evaluated these images and lab results as part of my medical  decision-making.   EKG Interpretation None      MDM   Final diagnoses:  Surgery, elective  S/p tibial fracture    The patient is a 40 year old male presented today after his right leg was crushed between a forklift and the bumper of a car. Reports that his left leg also was have but no deformity. EMS arrived and found laceration over the leg and given 300 mcg of fentanyl en route.  On exam the patient is hemodynamically stable in moderate distress. Right lower extremity evaluated with laceration overlying bony deformity. Likely open fracture and given Ancef in the ED. Patient reports that tetanus is up-to-date. X-rays performed showing a proximal tibia fracture of the right leg. Left LE x-rays unremarkable.  Denies striking his head chest or abdomen and no symptoms besides right leg pain. No other evidence of trauma on physical exam and do not feel further imaging warranted. Discussed with orthopedics who evaluated the patient in the emergency department and agreed to admission.  Labs and images were viewed by myself and incorporated into medical decision making.  Discussed pertinent finding with patient or caregiver prior to admission with no further questions.  Pt care supervised by my attending Dr. Marisue Brooklyn, MD PGY-3 Emergency Medicine     Tery Sanfilippo, MD 05/28/16 2349   Rolland Porter, MD 06/06/16 [phone]

## 2016-05-28 NOTE — Anesthesia Preprocedure Evaluation (Addendum)
Anesthesia Evaluation  Patient identified by MRN, date of birth, ID band Patient awake    Reviewed: Allergy & Precautions, NPO status , Patient's Chart, lab work & pertinent test results  Airway Mallampati: II  TM Distance: >3 FB Neck ROM: Full    Dental no notable dental hx. (+) Teeth Intact,    Pulmonary neg pulmonary ROS,    Pulmonary exam normal breath sounds clear to auscultation       Cardiovascular negative cardio ROS Normal cardiovascular exam Rhythm:Regular Rate:Normal     Neuro/Psych negative neurological ROS  negative psych ROS   GI/Hepatic negative GI ROS, Neg liver ROS,   Endo/Other  negative endocrine ROS  Renal/GU negative Renal ROS  negative genitourinary   Musculoskeletal negative musculoskeletal ROS (+)   Abdominal   Peds negative pediatric ROS (+)  Hematology negative hematology ROS (+)   Anesthesia Other Findings   Reproductive/Obstetrics negative OB ROS                            Anesthesia Physical Anesthesia Plan  ASA: I  Anesthesia Plan: General   Post-op Pain Management:    Induction: Intravenous  Airway Management Planned: Oral ETT  Additional Equipment:   Intra-op Plan:   Post-operative Plan: Extubation in OR  Informed Consent: I have reviewed the patients History and Physical, chart, labs and discussed the procedure including the risks, benefits and alternatives for the proposed anesthesia with the patient or authorized representative who has indicated his/her understanding and acceptance.   Dental advisory given  Plan Discussed with: CRNA and Surgeon  Anesthesia Plan Comments: (Solids at 1100 am )       Anesthesia Quick Evaluation

## 2016-05-28 NOTE — H&P (Signed)
No primary care provider on file. Chief Complaint: Fork lift hit right leg.  Level 2 trauma activation History: Pt is coming from work. Pt had a forklift pin him in after the emergency break failed.  Notes isolated right leg pain and laceration.   Sensory noted to be WNL. Pt alert and oriented x4 upon arrival  History reviewed. No pertinent past medical history.  No Known Allergies  No current facility-administered medications on file prior to encounter.   No current outpatient prescriptions on file prior to encounter.    Physical Exam: Filed Vitals:   05/28/16 1730 05/28/16 1745  BP: 120/75 128/66  Pulse: 53 57  Temp:    Resp: 17 11  A+O X3 NVI: EHL/TA/GA intact.  Compartments soft/NT Anterior 1 cm laceration noted - no active bleeding noted No ankle/hip pain with palpation. 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 Initial encounter. EXAM: LEFT TIBIA AND FIBULA - 2 VIEW COMPARISON:  None. FINDINGS: No acute bony or joint abnormality is identified. Well corticated osseous fragment off the medial talus is compatible with old trauma. Soft tissues are unremarkable. IMPRESSION: No acute abnormality. Electronically Signed   By: Drusilla Kannerhomas  Dalessio M.D.   On: 05/28/2016 17:07   Dg Tibia/fibula Right  05/28/2016  CLINICAL DATA:  Injured at work today. EXAM: RIGHT TIBIA AND FIBULA - 2 VIEW COMPARISON:  None. FINDINGS: There is a mildly displaced transverse fracture involving the upper tibial shaft. No fibular fracture is identified. The knee and ankle joints are intact. IMPRESSION: Mild posterior displacement of a proximal tibial shaft fracture. Electronically Signed   By: Rudie MeyerP.  Gallerani M.D.   On: 05/28/2016 17:08   Dg Chest Portable 1 View  05/28/2016  CLINICAL DATA:  Injured today at work. EXAM: PORTABLE CHEST 1 VIEW COMPARISON:  None. FINDINGS: The cardiac  silhouette, mediastinal and hilar contours are within normal limits given the AP projection and low lung volumes. No acute pulmonary findings. The bony thorax is intact. IMPRESSION: No acute cardiopulmonary findings. Electronically Signed   By: Rudie MeyerP.  Gallerani M.D.   On: 05/28/2016 17:06    A/P:  Otherwise healthy young man who was injured at work approximately 2 hrs ago.   Xrays demonstrate transverse proximal 1/3 tibia fracture with some displacement - minimal comminution. Plan on I+D of open fracture with IM nail fixation today Risks reviewed: infection, bleeding, nerve damage, need for further surgery, non-union or mal-union, hardware failure, ongoing or worse pain, death, stroke, paralysis.

## 2016-05-28 NOTE — ED Notes (Signed)
MD Shon BatonBrooks at the bedside

## 2016-05-28 NOTE — Progress Notes (Signed)
Received from PACU via bed.  Right leg surg, incisions x 2 CDI, Ice to both incisions.  Pain level #5 upon transfer.  +2 ppp, right foot warm, wiggles toes.  SCD on left leg.  Patient & family oriented to room, safety precautions & plan of care.

## 2016-05-28 NOTE — Anesthesia Procedure Notes (Signed)
Procedure Name: Intubation Date/Time: 05/28/2016 7:54 PM Performed by: Sharlene DoryWALKER, Ahmia Colford E Pre-anesthesia Checklist: Patient identified, Emergency Drugs available, Suction available, Patient being monitored and Timeout performed Patient Re-evaluated:Patient Re-evaluated prior to inductionOxygen Delivery Method: Circle system utilized Preoxygenation: Pre-oxygenation with 100% oxygen Intubation Type: IV induction and Rapid sequence Laryngoscope Size: Mac and 4 Grade View: Grade I Tube type: Oral Tube size: 7.5 mm Number of attempts: 1 Airway Equipment and Method: Stylet Placement Confirmation: ETT inserted through vocal cords under direct vision,  positive ETCO2 and breath sounds checked- equal and bilateral Secured at: 22 cm Tube secured with: Tape Dental Injury: Teeth and Oropharynx as per pre-operative assessment

## 2016-05-28 NOTE — Progress Notes (Signed)
[facility] responded to a level 2 trauma in trauma B. Patient was responsive and had notified his wife and mother as he was arriveing. Level 2 was downgraded to no level. Patients family was escorted to consult B.  Roberto Bates    05/28/16 1700  Clinical Encounter Type  Visited With Patient;Family  Visit Type Initial;Spiritual support;Social support;Code;ED;Trauma (Level 2)  Referral From Nurse  Spiritual Encounters  Spiritual Needs Prayer;Emotional

## 2016-05-28 NOTE — Brief Op Note (Signed)
05/28/2016  9:55 PM  PATIENT:  Avis EpleyEarl Inch  40 y.o. male  PRE-OPERATIVE DIAGNOSIS:  open tibia fracture  POST-OPERATIVE DIAGNOSIS:  * No post-op diagnosis entered *  PROCEDURE:  Procedure(s): INTRAMEDULLARY (IM) NAIL TIBIAL (Right) IRRIGATION AND DEBRIDEMENT OF OPEN FRACTURE RIGHT TIBIA (Right)  SURGEON:  Surgeon(s) and Role:    * Venita Lickahari Thorn Demas, MD - Primary  PHYSICIAN ASSISTANT:   ASSISTANTS: none   ANESTHESIA:   general  EBL:  Total I/O In: 1000 [I.V.:1000] Out: 400 [Urine:300; Blood:100]  BLOOD ADMINISTERED:none  DRAINS: none   LOCAL MEDICATIONS USED:  NONE  SPECIMEN:  No Specimen  DISPOSITION OF SPECIMEN:  N/A  COUNTS:  YES  TOURNIQUET:  * No tourniquets in log *  DICTATION: .Other Dictation: Dictation Number [id number]  PLAN OF CARE: Admit to inpatient   PATIENT DISPOSITION:  PACU - hemodynamically stable.

## 2016-05-28 NOTE — ED Notes (Signed)
Family at beside. Family given emotional support. 

## 2016-05-28 NOTE — Anesthesia Postprocedure Evaluation (Signed)
Anesthesia Post Note  Patient: Roberto Bates  Procedure(s) Performed: Procedure(s) (LRB): INTRAMEDULLARY (IM) NAIL TIBIAL (Right) IRRIGATION AND DEBRIDEMENT OF OPEN FRACTURE RIGHT TIBIA (Right)  Patient location during evaluation: PACU Anesthesia Type: General Level of consciousness: awake and alert Pain management: pain level controlled Vital Signs Assessment: post-procedure vital signs reviewed and stable Respiratory status: spontaneous breathing, nonlabored ventilation, respiratory function stable and patient connected to nasal cannula oxygen Cardiovascular status: blood pressure returned to baseline and stable Postop Assessment: no signs of nausea or vomiting Anesthetic complications: no    Last Vitals:  Filed Vitals:   05/28/16 2257 05/28/16 2305  BP: 139/84   Pulse: 66 61  Temp:  36.6 C  Resp:      Last Pain:  Filed Vitals:   05/28/16 2307  PainSc: 7                  Yasmine Kilbourne S

## 2016-05-28 NOTE — Progress Notes (Signed)
Orthopedic Tech Progress Note Patient Details:  Roberto Bates [birth year] [id number]  Patient ID: Roberto Bates, male   DOB: [birth year], 40 y.o.   MRN: [id number]   Saul FordyceJennifer C Delva Derden 05/28/2016, 4:39 PMLevel 2 trauma.

## 2016-05-29 ENCOUNTER — Encounter (HOSPITAL_COMMUNITY): Payer: Self-pay | Admitting: Orthopedic Surgery

## 2016-05-29 NOTE — Evaluation (Signed)
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  Clinical Impression  Patient presents with pain, swelling and post surgical deficits RLE s/p above surgery. Pt hypersensitive to light touch on RLE. Tolerated transfers and hopping to get to chair with Min guard assist for safety/balance. Pt will be home alone during the day at discharge and has stairs to climb to get into home. Will progress gait training and stair training tomorrow as tolerated. Education re: elevation, AROM, desensitization techniques and NWB RLE. Will follow acutely to maximize independence and mobility prior to return home.    Follow Up Recommendations No PT follow up;Supervision for mobility/OOB    Equipment Recommendations  Rolling walker with 5" wheels;3in1 (PT);Other (comment) (tub bench)    Recommendations for Other Services OT consult     Precautions / Restrictions Precautions Precautions: Fall Restrictions Weight Bearing Restrictions: Yes RLE Weight Bearing: Non weight bearing      Mobility  Bed Mobility Overal bed mobility: Needs Assistance Bed Mobility: Supine to Sit     Supine to sit: Min assist;HOB elevated     General bed mobility comments: Assist to support RLE to EOB. Increased time and cues for sequencing. Use of rails.  Transfers Overall transfer level: Needs assistance Equipment used: Rolling walker (2 wheeled) Transfers: Sit to/from Stand Sit to Stand: Min assist;From elevated surface         General transfer comment: Cues for hand placement; stood from high bed height. Able to hop x3 to get to chair.  Ambulation/Gait Ambulation/Gait assistance: Min guard Ambulation Distance (Feet): 3 Feet Assistive device: Rolling walker (2 wheeled) Gait  Pattern/deviations: Step-to pattern;Trunk flexed ("hop to")   Gait velocity interpretation: Below normal speed for age/gender General Gait Details: Able to take a few hops to get to chair with increased pain.   Stairs            Wheelchair Mobility    Modified Rankin (Stroke Patients Only)       Balance Overall balance assessment: Needs assistance Sitting-balance support: Feet supported;No upper extremity supported Sitting balance-Leahy Scale: Good Sitting balance - Comments: Requires UE support due to pain.   Standing balance support: During functional activity;Bilateral upper extremity supported Standing balance-Leahy Scale: Poor Standing balance comment: Reliant on BUEs for support in standing. Able to maintain NWB in standing.                             Pertinent Vitals/Pain Pain Assessment: 0-10 Pain Score: 8  Pain Location: RLE esp with mobility Pain Descriptors / Indicators: Sore;Throbbing Pain Intervention(s): Monitored during session;Repositioned;Patient requesting pain meds-RN notified;Ice applied;Limited activity within patient's tolerance    Home Living Family/patient expects to be discharged to:: Private residence Living Arrangements: Spouse/significant other Available Help at Discharge: Family;Available PRN/intermittently (wife works) Type of Home: House Home Access: Stairs to enter Newell RubbermaidEntrance Stairs-Rails: Can reach both (flimsy rails per pt) Entrance Stairs-Number of Steps: 4 Home Layout: One level Home Equipment: None      Prior Function Level of Independence: Independent         Comments: Works in [employer]. Drives.      Hand Dominance        Extremity/Trunk Assessment   Upper Extremity Assessment: Defer  to OT evaluation           Lower Extremity Assessment: RLE deficits/detail RLE Deficits / Details: Swelling present distal to knee; able to wiggle toes. Hypersensitive to touch.    Cervical / Trunk Assessment:  Normal  Communication   Communication: No difficulties  Cognition Arousal/Alertness: Awake/alert Behavior During Therapy: WFL for tasks assessed/performed Overall Cognitive Status: Within Functional Limits for tasks assessed                      General Comments General comments (skin integrity, edema, etc.): Education on the importance of elevation, AROM etc.    Exercises General Exercises - Lower Extremity Ankle Circles/Pumps: Both;5 reps;Supine Quad Sets: Both;5 reps;Supine      Assessment/Plan    PT Assessment Patient needs continued PT services  PT Diagnosis Difficulty walking;Acute pain   PT Problem List Decreased strength;Pain;Decreased range of motion;Impaired sensation;Decreased activity tolerance;Decreased balance;Decreased mobility;Decreased knowledge of use of DME  PT Treatment Interventions Balance training;Gait training;Stair training;Functional mobility training;Therapeutic activities;Therapeutic exercise;Patient/family education;DME instruction   PT Goals (Current goals can be found in the Care Plan section) Acute Rehab PT Goals Patient Stated Goal: none stated PT Goal Formulation: With patient Time For Goal Achievement: 06/12/16 Potential to Achieve Goals: Fair    Frequency Min 4X/week   Barriers to discharge Inaccessible home environment;Decreased caregiver support stairs to get into home; will be home alone    Co-evaluation               End of Session Equipment Utilized During Treatment: Gait belt Activity Tolerance: Patient limited by pain Patient left: in chair;with call bell/phone within reach;with family/visitor present Nurse Communication: Mobility status;Other (comment) (transfer technique)         Time: 1610-9604 PT Time Calculation (min) (ACUTE ONLY): 36 min   Charges:   PT Evaluation $PT Eval Moderate Complexity: 1 Procedure PT Treatments $Therapeutic Activity: 8-22 mins   PT G Codes:        Ryle Buscemi A  Solange Emry 05/29/2016, 12:55 PM Mylo Red, PT, DPT [phone]

## 2016-05-29 NOTE — Evaluation (Signed)
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   Clinical Impression   Eval limited due to pt 8/10 reports of R LE pain, fatigue and had recently worked with PT getting OOB to recliner. Pt declined any mobility with OT this session. Educated pt on use of ADL A/E at DME for home use. Pt is uncertain how much assist he will be able to have once he returns home since his wife works full time. Pt with decline in function and safety with ADLs and ADL mobility and would benefit from acute OT services to increase level of function and safety    Follow Up Recommendations  Home health OT    Equipment Recommendations  Tub/shower bench;Other (comment);Tub/shower seat (ADL A/E kit)    Recommendations for Other Services       Precautions / Restrictions Precautions Precautions: Fall Restrictions Weight Bearing Restrictions: Yes RLE Weight Bearing: Non weight bearing      Mobility Bed Mobility Overal bed mobility: Needs Assistance Bed Mobility: Supine to Sit     Supine to sit: Min assist;HOB elevated     General bed mobility comments: pt up in recliner  Transfers Overall transfer level: Needs assistance Equipment used: Rolling walker (2 wheeled) Transfers: Sit to/from Stand Sit to Stand: Min assist;From elevated surface         General transfer comment: pt declined any mobility. Per PT note, pt is min A for sit - stand and can hop to chair next to bed    Balance Overall balance assessment: Needs assistance Sitting-balance support: No upper extremity supported;Feet supported Sitting balance-Leahy Scale: Good Sitting balance - Comments: Requires UE support due to pain.   Standing balance support: During functional activity;Bilateral  upper extremity supported Standing balance-Leahy Scale: Poor Standing balance comment: pt declined sit - stand. Per PT note, pt is min A for sit - stand and can hop to chair next to bed                            ADL Overall ADL's : Needs assistance/impaired     Grooming: Wash/dry hands;Wash/dry face;Sitting;Set up   Upper Body Bathing: Set up;Sitting   Lower Body Bathing: Maximal assistance   Upper Body Dressing : Set up;Sitting   Lower Body Dressing: Total assistance     Toilet Transfer Details (indicate cue type and reason): pt declined any mobility. Per PT note, pt is min A for sit - stand and can hop to chair next to bed   Toileting - Clothing Manipulation Details (indicate cue type and reason): unable to assess at this time   Tub/Shower Transfer Details (indicate cue type and reason): pt declined any mobility. Per PT note, pt is min A for sit - stand and can hop to chair next to bed Functional mobility during ADLs:  (pt declined any mobility. Per PT note, pt is min A for sit - stand and can hop to chair next to bed) General ADL Comments: Pt declined any mobility with OT due to fatigue, 8/10 R LE pain and recently worked with PT getting up in recliner. initiated A/E and DME education     Vision  no visual deficits  Pertinent Vitals/Pain Pain Assessment: 0-10 Pain Score: 8  Pain Location: R LE Pain Descriptors / Indicators: Throbbing;Sore;Constant Pain Intervention(s): Limited activity within patient's tolerance;Ice applied;Premedicated before session     Hand Dominance Right   Extremity/Trunk Assessment Upper Extremity Assessment Upper Extremity Assessment: Overall WFL for tasks assessed   Lower Extremity Assessment Lower Extremity Assessment: Defer to PT evaluation RLE Deficits / Details: Swelling present distal to knee; able to wiggle toes. Hypersensitive to touch. RLE Sensation: decreased light touch RLE Coordination: decreased fine  motor;decreased gross motor   Cervical / Trunk Assessment Cervical / Trunk Assessment: Normal   Communication Communication Communication: No difficulties   Cognition Arousal/Alertness: Awake/alert Behavior During Therapy: WFL for tasks assessed/performed Overall Cognitive Status: Within Functional Limits for tasks assessed                     General Comments   pt pleasant                 Home Living Family/patient expects to be discharged to:: Private residence Living Arrangements: Spouse/significant other Available Help at Discharge: Family;Available PRN/intermittently (wife works full time) Type of Home: House Home Access: Stairs to enter Technical brewer of Steps: 4 Entrance Stairs-Rails: Can reach both Home Layout: One level     Bathroom Shower/Tub: Occupational psychologist: [place]: None          Prior Functioning/Environment Level of Independence: Independent        Comments: Works in [employer]. Drives.     OT Diagnosis: Acute pain   OT Problem List: Decreased knowledge of use of DME or AE;Decreased activity tolerance;Pain   OT Treatment/Interventions: Self-care/ADL training;Patient/family education;Therapeutic activities;DME and/or AE instruction    OT Goals(Current goals can be found in the care plan section) Acute Rehab OT Goals Patient Stated Goal: hurt less  OT Goal Formulation: With patient Time For Goal Achievement: 05/29/16 Potential to Achieve Goals: Good ADL Goals Pt Will Perform Lower Body Bathing: with caregiver independent in assisting;with mod assist;with adaptive equipment Pt Will Perform Lower Body Dressing: with max assist;with set-up;with mod assist;with caregiver independent in assisting;with adaptive equipment Pt Will Transfer to Toilet: with min assist;with min guard assist;bedside commode Pt Will Perform Toileting - Clothing Manipulation and hygiene: with mod assist;with  caregiver independent in assisting Pt Will Perform Tub/Shower Transfer: with min assist;with min guard assist;shower seat;tub bench  OT Frequency: Min 2X/week   Barriers to D/C:    pt unsure how much assist he will be able to have at home at this time                     End of Session    Activity Tolerance: Patient limited by pain;Patient limited by fatigue Patient left: in chair;with call bell/phone within reach   Time: 1353-1412 OT Time Calculation (min): 19 min Charges:  OT General Charges $OT Visit: 1 Procedure OT Evaluation $OT Eval Moderate Complexity: 1 Procedure G-Codes:    Britt Bottom 05/29/2016, 2:35 PM

## 2016-05-29 NOTE — Progress Notes (Signed)
    Subjective: 1 Day Post-Op Procedure(s) (LRB): INTRAMEDULLARY (IM) NAIL TIBIAL (Right) IRRIGATION AND DEBRIDEMENT OF OPEN FRACTURE RIGHT TIBIA (Right) Patient reports pain as 6 on 0-10 scale.   Denies CP or SOB.  Voiding without difficulty. Positive flatus. Objective: Vital signs in last 24 hours: Temp:  [97.8 F (36.6 C)-99.2 F (37.3 C)] 99.2 F (37.3 C) (07/21 0500) Pulse Rate:  [53-83] 64 (07/21 0500) Resp:  [10-29] 16 (07/21 0500) BP: (102-140)/(57-85) 126/57 mmHg (07/21 0500) SpO2:  [93 %-100 %] 99 % (07/21 0500) Weight:  [99.7 kg (219 lb 12.8 oz)-99.791 kg (220 lb)] 99.7 kg (219 lb 12.8 oz) (07/20 2339)  Intake/Output from previous day: 07/20 0701 - 07/21 0700 In: 2120 [P.O.:920; I.V.:1100; IV Piggyback:100] Out: 1050 [Urine:950; Blood:100] Intake/Output this shift:    Labs:  Recent Labs  05/28/16 1637  HGB 15.7    Recent Labs  05/28/16 1637  WBC 11.0*  RBC 5.79  HCT 46.8  PLT 200    Recent Labs  05/28/16 1637  NA 139  K 3.9  CL 111  CO2 23  BUN 21*  CREATININE 1.34*  GLUCOSE 127*  CALCIUM 8.7*    Recent Labs  05/28/16 1637  INR 1.03    Physical Exam: Neurologically intact ABD soft Incision: dressing C/D/I and scant drainage Compartment soft  Assessment/Plan: 1 Day Post-Op Procedure(s) (LRB): INTRAMEDULLARY (IM) NAIL TIBIAL (Right) IRRIGATION AND DEBRIDEMENT OF OPEN FRACTURE RIGHT TIBIA (Right) Advance diet Up with therapy    Karanveer Ramakrishnan D for Dr. Venita Lickahari Nickey Canedo [facility] [phone] 05/29/2016, 7:46 AM

## 2016-05-29 NOTE — Progress Notes (Signed)
Dressing changed, patient tolerated well.

## 2016-05-30 NOTE — Progress Notes (Signed)
PT Cancellation Note  Patient Details Name: Roberto Bates MRN: [id number] DOB: 21-Feb-1976   Cancelled Treatment:    Reason Eval/Treat Not Completed: Pain limiting ability to participate. Max encouragement to participate with PT.  Patient declined due to pain.  Explained importance of mobility/OOB.  Patient continued to decline.  Will return in am for PT session.   [name] 05/30/2016, 3:38 PM Durenda Hurt. Renaldo Fiddler, [facility] Acute Rehab Services Pager [phone]

## 2016-05-30 NOTE — Progress Notes (Signed)
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   OT comments  Pt continues to be limited by severe RLE pain and demonstrated self-limiting behavior this session. Pt required mod assist for bed mobility to sit EOB and refused to mobilize further due to pain. Had lengthy discussion with pt and pt's mother about the importance of mobilizing frequently to prevent blood clots and in order to be able to discharge home safely since pt has no assistance for 10-12 hours/day. At this time, I am very concerned about pt discharging home tomorrow and being alone for most of the day. Explained to pt that in order to go home he will have to mobilize tomorrow and his wife will need to demonstrate ability to complete all necessary transfers tomorrow or otherwise we will have to re-evaluate his discharge disposition to reflect increased physical assistance need.    Follow Up Recommendations  Home health OT;Supervision/Assistance - 24 hour - if unable to mobilize better tomorrow, will need to consider SNF or try for CIR    Equipment Recommendations  3 in 1 bedside comode;Tub/shower bench;Wheelchair (measurements OT);Other (comment) (Adaptive equipment)    Recommendations for Other Services      Precautions / Restrictions Precautions Precautions: Fall Restrictions Weight Bearing Restrictions: Yes RLE Weight Bearing: Non weight bearing       Mobility Bed Mobility Overal bed mobility: Needs Assistance Bed Mobility: Supine to Sit;Sit to Supine     Supine to sit: Mod assist;HOB elevated Sit to supine: Mod assist   General bed mobility comments: Mod assist to support RLE on/off bed. VCs for hand placement and relaxation/breathing strategies.  Transfers                  General transfer comment: Not attempted this session due to severe RLE pain.     Balance Overall balance assessment: Needs assistance Sitting-balance support: Bilateral upper extremity supported;Feet supported Sitting balance-Leahy Scale: Fair                             ADL Overall ADL's : Needs assistance/impaired         Upper Body Bathing: Min guard;Sitting   Lower Body Bathing: Maximal assistance;Sitting/lateral leans   Upper Body Dressing : Min guard;Sitting   Lower Body Dressing: Maximal assistance;Sitting/lateral leans                 General ADL Comments: Pt unable to mobilize further than sitting EOB due to severe RLE - had lengthy discussion with pt and pt's mother about importance of mobilizing to prevent blood clots.       Vision                     Perception     Praxis      Cognition   Behavior During Therapy: [facility] for tasks assessed/performed Overall Cognitive Status: Within Functional Limits for tasks assessed                       Extremity/Trunk Assessment               Exercises     Shoulder Instructions       General Comments  Pertinent Vitals/ Pain       Pain Assessment: 0-10 Pain Score: 10-Worst pain ever Pain Location: RLE Pain Descriptors / Indicators: Sore;Shooting;Throbbing Pain Intervention(s): Limited activity within patient's tolerance;Monitored during session;Repositioned;Patient requesting pain meds-RN notified;RN gave pain meds during session;Ice applied  Home Living                                          Prior Functioning/Environment              Frequency Min 3X/week     Progress Toward Goals  OT Goals(current goals can now be found in the care plan section)  Progress towards OT goals: Not progressing toward goals - comment (limited by pain)  Acute Rehab OT Goals Patient Stated Goal: to be able to go home OT Goal Formulation: With  patient Time For Goal Achievement: 06/12/16 Potential to Achieve Goals: Good ADL Goals Pt Will Perform Lower Body Bathing: with caregiver independent in assisting;with mod assist;with adaptive equipment Pt Will Perform Lower Body Dressing: with max assist;with set-up;with mod assist;with caregiver independent in assisting;with adaptive equipment Pt Will Transfer to Toilet: with min assist;with min guard assist;bedside commode Pt Will Perform Toileting - Clothing Manipulation and hygiene: with mod assist;with caregiver independent in assisting Pt Will Perform Tub/Shower Transfer: with min assist;with min guard assist;shower seat;tub bench  Plan Frequency needs to be updated    Co-evaluation                 End of Session Equipment Utilized During Treatment: Gait belt   Activity Tolerance Patient limited by pain   Patient Left in bed;with call bell/phone within reach;with family/visitor present   Nurse Communication Mobility status        Time: 8127-5170 OT Time Calculation (min): 32 min  Charges: OT General Charges $OT Visit: 1 Procedure OT Treatments $Self Care/Home Management : 23-37 mins  Nils Pyle, OTR/L Pager: [phone] 05/30/2016, 3:01 PM

## 2016-05-30 NOTE — Progress Notes (Signed)
Subjective: 2 Days Post-Op Procedure(s) (LRB): INTRAMEDULLARY (IM) NAIL TIBIAL (Right) IRRIGATION AND DEBRIDEMENT OF OPEN FRACTURE RIGHT TIBIA (Right) Patient reports pain as to right leg. Pain controlled well. No numbness or tingling. Tolerating Po's Denies Cp or SOb.  Objective: Vital signs in last 24 hours: Temp:  [99.3 F (37.4 C)-99.9 F (37.7 C)] 99.9 F (37.7 C) (07/22 0902) Pulse Rate:  [68-92] 92 (07/22 0902) Resp:  [16-20] 20 (07/22 0902) BP: (124-140)/(58-75) 128/75 mmHg (07/22 0902) SpO2:  [90 %-96 %] 94 % (07/22 0902)  Intake/Output from previous day: 07/21 0701 - 07/22 0700 In: -  Out: 650 [Urine:650] Intake/Output this shift:     Recent Labs  05/28/16 1637  HGB 15.7    Recent Labs  05/28/16 1637  WBC 11.0*  RBC 5.79  HCT 46.8  PLT 200    Recent Labs  05/28/16 1637  NA 139  K 3.9  CL 111  CO2 23  BUN 21*  CREATININE 1.34*  GLUCOSE 127*  CALCIUM 8.7*    Recent Labs  05/28/16 1637  INR 1.03    Well nourished. Alert and oriented x3. RRR, Lungs clear, BS x4. Abdomen soft and non tender. Right Calf soft and non tender. Right leg dressing C/D/I. No DVT signs. Compartment soft. No signs of infection.  Right LE grossly neurovascular intact.  Assessment/Plan: 2 Days Post-Op Procedure(s) (LRB): INTRAMEDULLARY (IM) NAIL TIBIAL (Right) IRRIGATION AND DEBRIDEMENT OF OPEN FRACTURE RIGHT TIBIA (Right) Continue current care Plan d/c tomorrow PT/OT Monitor N/v status. Doing well  STILWELL, BRYSON L 05/30/2016, 10:06 AM

## 2016-05-31 NOTE — Progress Notes (Signed)
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    PT Comments    Patient making slow progress with mobility.  Was able to perform stand-pivot transfer to chair with moderate assist.  Patient declined further mobility due to increase in pain.  Patient concerned about going home without assist during weekdays.  Recommend Inpatient Rehab consult with goal for patient to reach Mod I level for transfers and w/c mobility to return home safely.   Follow Up Recommendations  CIR; Supervision/Assistance - 24 hour     Equipment Recommendations  Rolling walker with 5" wheels;3in1 (PT);Wheelchair (measurements PT);Wheelchair cushion (measurements PT)    Recommendations for Other Services       Precautions / Restrictions Precautions Precautions: Fall Restrictions Weight Bearing Restrictions: Yes RLE Weight Bearing: Non weight bearing    Mobility  Bed Mobility Overal bed mobility: Needs Assistance Bed Mobility: Supine to Sit     Supine to sit: Min assist     General bed mobility comments: Min assist to move RLE off bed and place on floor. VCs for hand placement.  Transfers Overall transfer level: Needs assistance Equipment used: Rolling walker (2 wheeled) Transfers: Sit to/from [employer] Sit to Stand: Mod assist;From elevated surface Stand pivot transfers: Mod assist       General transfer comment: Verbal cues for hand placement and NWB RLE.  Two attempts before able to stand.  Assist to power up to standing and for balance.  Patient initially scooting LLE along floor to pivot to chair.  Instructed patient to take hop-steps with LLE using UE's on RW for support.  Verbal cues for hand placement and assist to control descent into  chair.  Ambulation/Gait                 Stairs            Wheelchair Mobility    Modified Rankin (Stroke Patients Only)       Balance Overall balance assessment: Needs assistance Sitting-balance support: No upper extremity supported;Feet supported Sitting balance-Leahy Scale: Fair Sitting balance - Comments: limited due to pain   Standing balance support: Bilateral upper extremity supported Standing balance-Leahy Scale: Poor Standing balance comment: Reliant on UE support to maintain standing balance. Able to maintain NWB RLE in standing.                    Cognition Arousal/Alertness: Awake/alert Behavior During Therapy: WFL for tasks assessed/performed Overall Cognitive Status: Within Functional Limits for tasks assessed                      Exercises General Exercises - Lower Extremity Ankle Circles/Pumps: AROM;Right;Seated;Limitations Ankle Circles/Pumps Limitations: Edema noted Rt lower leg, ankle, and foot.  Encouraged patient to move ankle/toes to help decrease edema    General Comments        Pertinent Vitals/Pain Pain Assessment: 0-10 Pain Score: 5  Pain Location: RLE with mobility Pain Descriptors / Indicators: Throbbing;Sore Pain Intervention(s): Limited activity within patient's tolerance;Monitored during session;Premedicated before session;Repositioned;Ice applied    Home Living                      Prior Function  PT Goals (current goals can now be found in the care plan section) Acute Rehab PT Goals Patient Stated Goal: to be able to go home Progress towards PT goals: Progressing toward goals (Very slow progress)    Frequency  Min 4X/week    PT Plan Current plan remains appropriate;Equipment recommendations need to be updated    Co-evaluation PT/OT/SLP Co-Evaluation/Treatment: Yes Reason for Co-Treatment: For patient/therapist safety PT goals addressed during session: Mobility/safety with  mobility OT goals addressed during session: ADL's and self-care;Proper use of Adaptive equipment and DME     End of Session Equipment Utilized During Treatment: Gait belt Activity Tolerance: Patient limited by pain Patient left: in chair;with call bell/phone within reach     Time: 4540-9811 PT Time Calculation (min) (ACUTE ONLY): 23 min  Charges:  $Therapeutic Activity: 8-22 mins                    G Codes:      [name] 23-Jun-2016, 11:21 AM Durenda Hurt. Renaldo Fiddler, [facility] Acute Rehab Services Pager [phone]

## 2016-05-31 NOTE — Progress Notes (Signed)
Rounded on patient at this time. Patient denies any pain and did not want any thing for pain. Will continue to monitor.

## 2016-05-31 NOTE — Progress Notes (Signed)
Subjective: 3 Days Post-Op Procedure(s) (LRB): INTRAMEDULLARY (IM) NAIL TIBIAL (Right) IRRIGATION AND DEBRIDEMENT OF OPEN FRACTURE RIGHT TIBIA (Right) Patient reports pain as moderate to severe. Reports pain does not feel as well controlled as yesterday. Does not feel ready to go home. He has voiced some concerns to his nurse that he does not feel he will be able to go home and is wondering if he will need to go to rehab.    Objective: Vital signs in last 24 hours: Temp:  [97.5 F (36.4 C)-99.9 F (37.7 C)] 98.7 F (37.1 C) (07/23 0605) Pulse Rate:  [58-106] 84 (07/23 0605) Resp:  [18-20] 18 (07/23 0605) BP: (122-143)/(59-75) 128/59 (07/23 0605) SpO2:  [94 %-97 %] 94 % (07/23 0605)  Intake/Output from previous day: No intake/output data recorded. Intake/Output this shift: No intake/output data recorded.   Recent Labs  05/28/16 1637  HGB 15.7    Recent Labs  05/28/16 1637  WBC 11.0*  RBC 5.79  HCT 46.8  PLT 200    Recent Labs  05/28/16 1637  NA 139  K 3.9  CL 111  CO2 23  BUN 21*  CREATININE 1.34*  GLUCOSE 127*  CALCIUM 8.7*    Recent Labs  05/28/16 1637  INR 1.03    Neurologically intact ABD soft Neurovascular intact Sensation intact distally Intact pulses distally Dorsiflexion/Plantar flexion intact Incision: dressing C/D/I and no drainage No cellulitis present Compartment soft no calf pain  Assessment/Plan: 3 Days Post-Op Procedure(s) (LRB): INTRAMEDULLARY (IM) NAIL TIBIAL (Right) IRRIGATION AND DEBRIDEMENT OF OPEN FRACTURE RIGHT TIBIA (Right) Advance diet Up with therapy D/C IV fluids  Able to flex and extend toes Sensation intact distally Ice and elevate Toes above the Nose to reduce swelling, at least 5-6x/day 20-30 minutes each NWB RLE Discussed with his nurse. If he is ready to go home later today please call me [phone] for D/C order otherwise plan to D/C tomorrow when pain is better controlled  Nemiah Bubar  M. 05/31/2016, 8:37 AM

## 2016-05-31 NOTE — Progress Notes (Signed)
Rehab Admissions Coordinator Note:  Patient was screened by Trish Mage for appropriateness for an Inpatient Acute Rehab Consult.  At this time, we are recommending Inpatient Rehab consult.  Trish Mage 05/31/2016, 10:36 AM  I can be reached at [phone].

## 2016-05-31 NOTE — Progress Notes (Signed)
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   OT comments  Pt with more controlled pain today and willing to mobilize. Pt required mod assist to complete stand-pivot transfer to chair, but declined to mobilize further due to increased pain. Pt voiced concerns about going home with such limited help during the weekdays. Will recommend CIR consult as I feel pt is an excellent candidate and has great potential to reach mod I level for transfers and w/c mobility while healing from injury. Will continue to follow acutely.   Follow Up Recommendations  CIR;Supervision/Assistance - 24 hour    Equipment Recommendations  3 in 1 bedside comode;Tub/shower bench;Other (comment) (Reacher, sock aid, long-handled sponge)    Recommendations for Other Services      Precautions / Restrictions Precautions Precautions: Fall Restrictions Weight Bearing Restrictions: Yes RLE Weight Bearing: Non weight bearing       Mobility Bed Mobility Overal bed mobility: Needs Assistance Bed Mobility: Supine to Sit     Supine to sit: Min assist     General bed mobility comments: Min assist to move RLE off bed and place on floor. VCs for hand placement.  Transfers Overall transfer level: Needs assistance Equipment used: Rolling walker (2 wheeled) Transfers: Sit to/from Stand Sit to Stand: Mod assist;From elevated surface         General transfer comment: Mod assist for boost to stand and to stabilize balance upon standing. VCs for hand placement and step sequence with RW.    Balance Overall balance assessment: Needs assistance Sitting-balance support: No upper extremity supported;Feet supported Sitting balance-Leahy Scale: Fair Sitting balance - Comments:  limited due to pain   Standing balance support: Bilateral upper extremity supported;During functional activity Standing balance-Leahy Scale: Poor Standing balance comment: Reliant on UE support to maintain standing balance. Able to maintain NWB RLE in standing.                   ADL Overall ADL's : Needs assistance/impaired     Grooming: Set up;Sitting   Upper Body Bathing: Set up;Sitting   Lower Body Bathing: Maximal assistance;Sit to/from stand   Upper Body Dressing : Set up;Sitting   Lower Body Dressing: Maximal assistance;Sit to/from stand   Toilet Transfer: Moderate assistance;Cueing for safety;Stand-pivot;BSC;RW Toilet Transfer Details (indicate cue type and reason): simulated to chair Toileting- Clothing Manipulation and Hygiene: Maximal assistance;Sit to/from stand       Functional mobility during ADLs: Moderate assistance;Rolling walker General ADL Comments: Pt still demonstrating self-limiting behavior - needs increased encouragement      Vision                     Perception     Praxis      Cognition   Behavior During Therapy: WFL for tasks assessed/performed Overall Cognitive Status: Within Functional Limits for tasks assessed                       Extremity/Trunk Assessment               Exercises     Shoulder Instructions       General Comments      Pertinent Vitals/ Pain       Pain Assessment: 0-10  Pain Score: 5  Pain Location: RLE especially with movement Pain Descriptors / Indicators: Throbbing;Sore Pain Intervention(s): Limited activity within patient's tolerance;Monitored during session;Premedicated before session;Repositioned;Ice applied  Home Living                                          Prior Functioning/Environment              Frequency Min 3X/week     Progress Toward Goals  OT Goals(current goals can now be found in the care plan section)  Progress towards OT goals:  Progressing toward goals  Acute Rehab OT Goals Patient Stated Goal: to be able to go home OT Goal Formulation: With patient Time For Goal Achievement: 06/12/16 Potential to Achieve Goals: Good ADL Goals Pt Will Perform Lower Body Bathing: with caregiver independent in assisting;with mod assist;with adaptive equipment Pt Will Perform Lower Body Dressing: with max assist;with set-up;with mod assist;with caregiver independent in assisting;with adaptive equipment Pt Will Transfer to Toilet: with min assist;with min guard assist;bedside commode Pt Will Perform Toileting - Clothing Manipulation and hygiene: with mod assist;with caregiver independent in assisting Pt Will Perform Tub/Shower Transfer: with min assist;with min guard assist;shower seat;tub bench  Plan Discharge plan needs to be updated    Co-evaluation    PT/OT/SLP Co-Evaluation/Treatment: Yes Reason for Co-Treatment: For patient/therapist safety   OT goals addressed during session: ADL's and self-care;Proper use of Adaptive equipment and DME      End of Session Equipment Utilized During Treatment: Gait belt;Rolling walker   Activity Tolerance Patient limited by pain   Patient Left in chair;with call bell/phone within reach   Nurse Communication Mobility status        Time: 8469-6295 OT Time Calculation (min): 23 min  Charges: OT General Charges $OT Visit: 1 Procedure OT Treatments $Self Care/Home Management : 8-22 mins  Nils Pyle, OTR/L Pager: [phone] 05/31/2016, 10:24 AM

## 2016-06-01 DIAGNOSIS — S82201D Unspecified fracture of shaft of right tibia, subsequent encounter for closed fracture with routine healing: Secondary | ICD-10-CM

## 2016-06-01 DIAGNOSIS — N179 Acute kidney failure, unspecified: Secondary | ICD-10-CM

## 2016-06-01 DIAGNOSIS — Z8781 Personal history of (healed) traumatic fracture: Secondary | ICD-10-CM

## 2016-06-01 DIAGNOSIS — D72829 Elevated white blood cell count, unspecified: Secondary | ICD-10-CM

## 2016-06-01 DIAGNOSIS — S8291XD Unspecified fracture of right lower leg, subsequent encounter for closed fracture with routine healing: Secondary | ICD-10-CM

## 2016-06-01 NOTE — Consult Note (Signed)
Physical Medicine and Rehabilitation Consult  Reason for Consult: Left tibial fracture/Workers Comp injury Referring Physician: Dr. Shon Baton   HPI: Roberto Bates is a 40 y.o. male with no known pmh who was admitted on 05/28/16 after being pinned by a forklift at work. He sustained laceration and had complaints of RLE pain due to open transverse proximal 1/3 tibial fracture with displacement and mild comminution. He was taken to OR for I & D with IM nail of right Tib-Fib fracture and examination of right knee by Dr. Shon Baton. Post op NWB RLE and has been limited by pain and anxiety.  CIR recommended for follow up therapy.    Review of Systems  Constitutional: Negative for chills and fever.  HENT: Negative for hearing loss.   Eyes: Negative for blurred vision and double vision.  Respiratory: Negative for cough and shortness of breath.   Cardiovascular: Negative for chest pain and palpitations.  Gastrointestinal: Negative for constipation and heartburn.  Genitourinary: Negative for dysuria and urgency.  Musculoskeletal: Negative for back pain, myalgias and neck pain.  Skin: Negative for itching and rash.  Neurological: Negative for dizziness, focal weakness and headaches.  Psychiatric/Behavioral: Negative for depression and suicidal ideas. The patient is nervous/anxious and has insomnia.   All other systems reviewed and are negative.     History reviewed. No pertinent past medical history.    Past Surgical History:  Procedure Laterality Date  . APPENDECTOMY    . CHOLECYSTECTOMY    . I&D EXTREMITY Right 05/28/2016   Procedure: IRRIGATION AND DEBRIDEMENT OF OPEN FRACTURE RIGHT TIBIA;  Surgeon: Venita Lick, MD;  Location: [facility] OR;  Service: Orthopedics;  Laterality: Right;  . TIBIA IM NAIL INSERTION Right 05/28/2016   Procedure: INTRAMEDULLARY (IM) NAIL TIBIAL;  Surgeon: Venita Lick, MD;  Location: [facility] OR;  Service: Orthopedics;  Laterality: Right;     History reviewed. No  pertinent family history.    Social History:   [name] works. Independent and working PTA. reports that he has never smoked. He has never used smokeless tobacco. He reports that he drinks alcohol. He reports that he does not use drugs.    Allergies: No Known Allergies    Medications Prior to Admission  Medication Sig Dispense Refill  . Multiple Vitamin (MULTIVITAMIN WITH MINERALS) TABS tablet Take 1 tablet by mouth daily.      Home: Home Living Family/patient expects to be discharged to:: Private residence Living Arrangements: Spouse/significant other Available Help at Discharge: Family, Available PRN/intermittently (wife works full time) Type of Home: House Home Access: Stairs to enter Secretary/administrator of Steps: 4 Entrance Stairs-Rails: Can reach both Home Layout: One level Bathroom Shower/Tub: Health visitor: Standard Home Equipment: None  Functional History: Prior Function Level of Independence: Independent Comments: Works in [employer]. Drives.  Functional Status:  Mobility: Bed Mobility Overal bed mobility: Needs Assistance Bed Mobility: Supine to Sit Supine to sit: Min assist Sit to supine: Mod assist General bed mobility comments: Min assist to move RLE off bed and place on floor. VCs for hand placement. Transfers Overall transfer level: Needs assistance Equipment used: Rolling walker (2 wheeled) Transfers: Sit to/from Stand, [employer] Transfers Sit to Stand: Mod assist, From elevated surface Stand pivot transfers: Mod assist General transfer comment: Verbal cues for hand placement and NWB RLE.  Two attempts before able to stand.  Assist to power up to standing and for balance.  Patient initially scooting LLE along floor to pivot to chair.  Instructed patient  to take hop-steps with LLE using UE's on RW for support.  Verbal cues for hand placement and assist to control descent into chair. Ambulation/Gait Ambulation/Gait assistance:  Min guard Ambulation Distance (Feet): 3 Feet Assistive device: Rolling walker (2 wheeled) Gait Pattern/deviations: Step-to pattern, Trunk flexed ("hop to") General Gait Details: Able to take a few hops to get to chair with increased pain.  Gait velocity interpretation: Below normal speed for age/gender    ADL: ADL Overall ADL's : Needs assistance/impaired Grooming: Set up, Sitting Upper Body Bathing: Set up, Sitting Lower Body Bathing: Maximal assistance, Sit to/from stand Upper Body Dressing : Set up, Sitting Lower Body Dressing: Maximal assistance, Sit to/from stand Toilet Transfer: Moderate assistance, Cueing for safety, Stand-pivot, BSC, RW Toilet Transfer Details (indicate cue type and reason): simulated to chair Toileting- Clothing Manipulation and Hygiene: Maximal assistance, Sit to/from stand Toileting - Clothing Manipulation Details (indicate cue type and reason): unable to assess at this time Tub/Shower Transfer Details (indicate cue type and reason): pt declined any mobility. Per PT note, pt is min A for sit - stand and can hop to chair next to bed Functional mobility during ADLs: Moderate assistance, Rolling walker General ADL Comments: Pt still demonstrating self-limiting behavior - needs increased encouragement  Cognition: Cognition Overall Cognitive Status: Within Functional Limits for tasks assessed Orientation Level: Oriented X4 Cognition Arousal/Alertness: Awake/alert Behavior During Therapy: WFL for tasks assessed/performed Overall Cognitive Status: Within Functional Limits for tasks assessed  Blood pressure 110/66, pulse 81, temperature 98.3 F (36.8 C), temperature source Oral, resp. rate 20, height 5\' 11"  (1.803 m), weight 99.7 kg (219 lb 12.8 oz), SpO2 96 %. Physical Exam  Nursing note and vitals reviewed. Constitutional: He is oriented to person, place, and time. He appears well-developed and well-nourished. No distress.  Sitting in recliner chair with  RLE elevated.  HENT:  Head: Normocephalic and atraumatic.  Eyes: Conjunctivae and EOM are normal. Pupils are equal, round, and reactive to light.  Neck: Normal range of motion. Neck supple.  Cardiovascular: Normal rate and regular rhythm.   No murmur heard. Respiratory: Effort normal and breath sounds normal. No stridor. No respiratory distress. He has no wheezes.  GI: Soft. Bowel sounds are normal. He exhibits no distension. There is no tenderness.  Musculoskeletal: He exhibits edema and tenderness.  RLE ace wrapped from upper thigh to foot.  Moderate edema right foot.   Neurological: He is alert and oriented to person, place, and time. No cranial nerve deficit.  Speech clear.  Follows basic commands without difficulty.  Motor: B/l UE, LLE: 5/5 proximal to distal RLE: 2/5 hip flexion, ankle dorsi/plantar flexion Sensation diminished to light touch right foot.   Skin: Skin is warm and dry. He is not diaphoretic.  Psychiatric: He has a normal mood and affect. His behavior is normal. Thought content normal.    No results found for this or any previous visit (from the past 24 hour(s)). No results found.  Assessment/Plan: Diagnosis: Left tibial fracture Labs and images independently reviewed.  Records reviewed and summated above.  1. Does the need for close, 24 hr/day medical supervision in concert with the patient's rehab needs make it unreasonable for this patient to be served in a less intensive setting? No 2. Co-Morbidities requiring supervision/potential complications: ?AKI (avoid nephrotoxic meds), leukocytosis (likely reactive, cont to monitor for signs and symptoms of infection, further workup if indicated) 3. Due to safety, skin/wound care, pain management and patient education, does the patient require 24 hr/day rehab nursing?  No 4. Does the patient require coordinated care of a physician, rehab nurse, PT (1-2 hrs/day, 5 days/week) and OT (1-2 hrs/day, 5 days/week) to address  physical and functional deficits in the context of the above medical diagnosis(es)? No Addressing deficits in the following areas: balance, endurance, locomotion, strength, transferring, bathing, dressing and toileting 5. Can the patient actively participate in an intensive therapy program of at least 3 hrs of therapy per day at least 5 days per week? Yes 6. The potential for patient to make measurable gains while on inpatient rehab is good 7. Anticipated functional outcomes upon discharge from inpatient rehab are n/a  with PT, n/a with OT, n/a with SLP. 8. Estimated rehab length of stay to reach the above functional goals is: NA 9. Does the patient have adequate social supports and living environment to accommodate these discharge functional goals? Yes 10. Anticipated D/C setting: Home 11. Anticipated post D/C treatments: HH therapy and Home excercise program 12. Overall Rehab/Functional Prognosis: excellent  RECOMMENDATIONS: This patient's condition is appropriate for continued rehabilitative care in the following setting: HH Therapy and Home Excercise Program Patient has agreed to participate in recommended program. Yes Note that insurance prior authorization may be required for reimbursement for recommended care.  Comment: Rehab Admissions Coordinator to follow up.  Maryla Morrow, MD 06/01/2016

## 2016-06-01 NOTE — Care Management Note (Signed)
Case Management Note  Patient Details  Name: Roberto Bates MRN: [id number] Date of Birth: [birth year]  Subjective/Objective:                    Action/Plan: Pt under workers comp. CM spoke with the patient and his spouse and asked about his CM through the workers comp. He gave CM the name of Ricke Hey and he gave permission for CM to contact Jennette. CM spoke with [facility] and she transferred the call to a Mr Thurnell Lose. Information Mr Thurnell Lose requested was faxed to workers comp at the number provided: [phone]. CM will continue to follow for discharge needs.   Expected Discharge Date:  05/30/16               Expected Discharge Plan:     In-House Referral:     Discharge planning Services     Post Acute Care Choice:    Choice offered to:     DME Arranged:    DME Agency:     HH Arranged:    HH Agency:     Status of Service:     If discussed at [employer], dates discussed:    Additional Comments:  Kermit Balo, RN 06/01/2016, 2:34 PM

## 2016-06-01 NOTE — Progress Notes (Signed)
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    PT Comments    Pt progressing better today.  Able to get his knee mobilized;much better  Follow Up Recommendations  CIR;Supervision/Assistance - 24 hour     Equipment Recommendations  Rolling walker with 5" wheels;3in1 (PT);Wheelchair (measurements PT);Wheelchair cushion (measurements PT)    Recommendations for Other Services       Precautions / Restrictions Precautions Precautions: Fall Restrictions RLE Weight Bearing: Non weight bearing    Mobility  Bed Mobility Overal bed mobility: Needs Assistance Bed Mobility: Supine to Sit     Supine to sit: Min assist     General bed mobility comments: min assist at heel, o/w pt moved the RLE himself slowly  Transfers Overall transfer level: Needs assistance   Transfers: Sit to/from Stand;Stand Pivot Transfers Sit to Stand: Min guard Stand pivot transfers: Min guard       General transfer comment: cues for hand placement and demo'd technique  Ambulation/Gait Ambulation/Gait assistance: Min guard Ambulation Distance (Feet): 8 Feet Assistive device: Rolling walker (2 wheeled) Gait Pattern/deviations: Step-to pattern     General Gait Details: easily hopping in RW, but throbbing limiting amb.   Stairs            Wheelchair Mobility    Modified Rankin (Stroke Patients Only)       Balance   Sitting-balance support: Feet supported Sitting balance-Leahy Scale: Fair     Standing balance support: Bilateral upper extremity supported Standing balance-Leahy Scale: Poor Standing balance comment: reliant on UE support, but now switching to 1 hand and balancing more on 1 foot.                    Cognition  Arousal/Alertness: Awake/alert Behavior During Therapy: WFL for tasks assessed/performed Overall Cognitive Status: Within Functional Limits for tasks assessed                      Exercises Other Exercises Other Exercises: 10+ min working on knee/hip  flexion/ext with resistance through femur.  Emphasis on relaxing into flexion and extended knee.    General Comments General comments (skin integrity, edema, etc.): Education on importance on R LE ROM and gentle calf ROM/stretch.      Pertinent Vitals/Pain Pain Assessment: Faces Faces Pain Scale: Hurts little more Pain Location: RLE incision areas Pain Descriptors / Indicators: Throbbing;Sore Pain Intervention(s): Repositioned;Premedicated before session;Monitored during session    Home Living                      Prior Function            PT Goals (current goals can now be found in the care plan section) Acute Rehab PT Goals Patient Stated Goal: to be able to go home PT Goal Formulation: With patient Time For Goal Achievement: 06/12/16 Potential to Achieve Goals: Fair Progress towards PT goals: Progressing toward goals    Frequency  Min 4X/week    PT Plan Current plan remains appropriate;Equipment recommendations need to be updated    Co-evaluation             End of Session   Activity Tolerance: Patient limited by pain Patient left: in chair;with call bell/phone  within reach     Time: 1004-1034 PT Time Calculation (min) (ACUTE ONLY): 30 min  Charges:  $Therapeutic Exercise: 8-22 mins $Therapeutic Activity: 8-22 mins                    G Codes:      Hadas Jessop, Eliseo Gum 06/01/2016, 10:53 AM

## 2016-06-01 NOTE — Progress Notes (Signed)
Inpatient Rehabilitation  I met with the patient and his wife at the bedside to discuss the results and recommendations from the IP rehab consult.  Pt. does not need the intensity/medical management  of the IP rehab program and Dr. Posey Pronto is recommending home with [facility] therapies.  Pt. and wife asked appropriate questions and I answered all their questions.  They are requesting to speak with Jacqualin Combes, RNCM again.  I have notified Kelli of their request. I will sign off.    [place] Admissions Coordinator Cell [phone] Office [phone]

## 2016-06-01 NOTE — Progress Notes (Signed)
Patient ID: Roberto Bates, male   DOB: 1976-08-31, 40 y.o.   MRN: [id number]    Subjective: 4 Days Post-Op Procedure(s) (LRB): INTRAMEDULLARY (IM) NAIL TIBIAL (Right) IRRIGATION AND DEBRIDEMENT OF OPEN FRACTURE RIGHT TIBIA (Right) Patient reports pain as 7 on 0-10 scale.   Denies CP or SOB.  Voiding without difficulty. Positive flatus. Pt reports concerns about being at home on his own.  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  Pt has not heard from a case manager yet.  Pt is consulting with an attorney today too.  Objective: Vital signs in last 24 hours: Temp:  [98.2 F (36.8 C)-98.9 F (37.2 C)] 98.2 F (36.8 C) (07/24 0957) Pulse Rate:  [80-89] 80 (07/24 0957) Resp:  [16-20] 16 (07/24 0957) BP: (110-122)/(53-72) 122/68 (07/24 0957) SpO2:  [94 %-96 %] 96 % (07/24 0957)  Intake/Output from previous day: 07/23 0701 - 07/24 0700 In: 240 [P.O.:240] Out: 450 [Urine:450] Intake/Output this shift: No intake/output data recorded.  Labs: No results for input(s): HGB in the last 72 hours. No results for input(s): WBC, RBC, HCT, PLT in the last 72 hours. No results for input(s): NA, K, CL, CO2, BUN, CREATININE, GLUCOSE, CALCIUM in the last 72 hours. No results for input(s): LABPT, INR in the last 72 hours.  Physical Exam: Neurologically intact ABD soft Neurovascular intact Sensation intact distally Incision: no drainage Compartment soft  Assessment/Plan: 4 Days Post-Op Procedure(s) (LRB): INTRAMEDULLARY (IM) NAIL TIBIAL (Right) IRRIGATION AND DEBRIDEMENT OF OPEN FRACTURE RIGHT TIBIA (Right) Up with therapy  Consult for inpatient rehab Consult for SNF placement  [facility], Baxter Kail for Dr. Venita Lick [facility] [phone] 06/01/2016, 11:56 AM

## 2016-06-02 NOTE — Progress Notes (Signed)
Pt received phone call that bed will be delivered to his house tomorrow between 10-2pm. Pt has received letter from MD concerning his return to work and his other DME is in the room. Wife to transport patient home. Bedside RN updated.

## 2016-06-02 NOTE — Progress Notes (Signed)
Pt being discharged per orders from MD. Pt and spouse educated on discharge instructions. Pt and spouse verbalized understanding of instructions. All questions and concerns were addressed. Pt's IV was removed before discharge. Pt exited hospital via wheelchair. 

## 2016-06-02 NOTE — Progress Notes (Signed)
Patient ID: Roberto Bates, male   DOB: 1976/01/10, 40 y.o.   MRN: [id number]    Subjective: 5 Days Post-Op Procedure(s) (LRB): INTRAMEDULLARY (IM) NAIL TIBIAL (Right) IRRIGATION AND DEBRIDEMENT OF OPEN FRACTURE RIGHT TIBIA (Right) Patient reports pain as 5 on 0-10 scale.   Denies CP or SOB.  Voiding without difficulty. Positive BM. Pt says he is ready to go home. Objective: Vital signs in last 24 hours: Temp:  [97.7 F (36.5 C)-98.5 F (36.9 C)] 98.4 F (36.9 C) (07/25 0503) Pulse Rate:  [80-97] 87 (07/25 0503) Resp:  [16-20] 20 (07/25 0503) BP: (115-134)/(64-75) 123/64 (07/25 0503) SpO2:  [94 %-97 %] 94 % (07/25 0503)  Intake/Output from previous day: 07/24 0701 - 07/25 0700 In: 480 [P.O.:480] Out: 450 [Urine:450] Intake/Output this shift: Total I/O In: 480 [P.O.:480] Out: 450 [Urine:450]  Labs: No results for input(s): HGB in the last 72 hours. No results for input(s): WBC, RBC, HCT, PLT in the last 72 hours. No results for input(s): NA, K, CL, CO2, BUN, CREATININE, GLUCOSE, CALCIUM in the last 72 hours. No results for input(s): LABPT, INR in the last 72 hours.  Physical Exam: Neurologically intact ABD soft Sensation intact distally Intact pulses distally Incision: no drainage Compartment soft  Assessment/Plan: 5 Days Post-Op Procedure(s) (LRB): INTRAMEDULLARY (IM) NAIL TIBIAL (Right) IRRIGATION AND DEBRIDEMENT OF OPEN FRACTURE RIGHT TIBIA (Right) Discharge home with home health  Hospital Bed ordered yesterday for home use Medication scripts on pts chart Pt to f/u with Dr Shon Baton in clinic in 2 weeks  [facility], Baxter Kail for Dr. Venita Lick [facility] [phone] 06/02/2016, 6:58 AM

## 2016-06-02 NOTE — Progress Notes (Signed)
CM received call from [facility], RN from [place] the company that coordinates the PT and DME for the workers comp company [employer]). She states she just received the information about DME and PT this afternoon. She stated that the patient could receive the walker, 3 in 1 and tub bench from [facility] DME and that they would have the bed sent to his home but it would probably not arrive until tomorrow. Jermaine with [facility] DME notified and given the information for [employer] and the adjusters information and he will have the DME delivered to the patients room.  CM informed Mr Croan and his wife that bed will not be available until tomorrow and that the other DME will be delivered to the room. Both were understanding and accepting of the plan.  Pts wife asking to have a letter from the MD stating when the patient would be able to return to work. CM called Dr Shon Baton' office and left message with the secretary asking for a letter to be placed in [facility] or faxed to the unit. The secretary stated she would inform Porfirio Mylar or Dr Shon Baton about need for the letter.

## 2016-06-02 NOTE — Progress Notes (Signed)
OT Cancellation Note  Patient Details Name: Roberto Bates MRN: [id number] DOB: Apr 18, 1976   Cancelled Treatment:    Reason Eval/Treat Not Completed: Pain limiting ability to participate (pt just took pain meds, requesting OT check back later). Will follow up for OT treatment as time allows.  Gaye Alken M.S., OTR/L Pager: [phone]  06/02/2016, 8:41 AM

## 2016-06-02 NOTE — Progress Notes (Signed)
At 0800 this am CM spoke to [employer] with [employer] comp to go over the DME and [facility] needs of the patient. CM also faxed the orders for the DME and [facility] to Erin at [phone]. CM informed her that the patient is discharging today and will need the DME today.  At 1130 CM called Erin again to see the progress of the DME so Mr Griffo's wife could coordinate picking him up with delivery of equipment and her work. CM left message for Erin. Another representative of [employer] returned the call. Information relayed to her and she states they have the DME listed in the system and she will f/u to see if it can be delivered today. CM offered to use [facility] DME if needed since they would be able to provide the DME today. Representative to call CM back to give update on the DME. CM passed information on to the patient.

## 2016-06-02 NOTE — Progress Notes (Addendum)
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    PT Comments    Pt progressing slowly.  Pt deferred OOB, but participated in hip/knee flex/ext/ab/abd exercise with resistance through femur.  Worked on gentle heel cord stretch and knee extension.  Follow Up Recommendations  Home health PT     Equipment Recommendations  Rolling walker with 5" wheels;3in1 (PT);Wheelchair (measurements PT);Wheelchair cushion (measurements PT) (should accept 18 x18)    Recommendations for Other Services       Precautions / Restrictions Precautions Precautions: Fall Restrictions Weight Bearing Restrictions: Yes RLE Weight Bearing: Non weight bearing    Mobility  Bed Mobility Overal bed mobility: Needs Assistance Bed Mobility: Supine to Sit;Sit to Supine     Supine to sit: Min assist Sit to supine: Min assist   General bed mobility comments: Min assist to manage RLE to EOB and back into bed. Required assist for positioning of RLE.  Transfers Overall transfer level: Needs assistance Equipment used: Rolling walker (2 wheeled) Transfers: Sit to/from [employer] Sit to Stand: Min guard Stand pivot transfers: Min guard       General transfer comment: Good hand placement and technique. Able to maintain NWB on RLE thrhoughout.  Ambulation/Gait                 Stairs            Wheelchair Mobility    Modified Rankin (Stroke Patients Only)       Balance Overall balance assessment: Needs assistance Sitting-balance support: Feet supported;No upper extremity supported Sitting balance-Leahy Scale: Good     Standing balance support: Bilateral upper extremity supported;During functional activity Standing balance-Leahy  Scale: Poor Standing balance comment: RW for support.                    Cognition Arousal/Alertness: Awake/alert Behavior During Therapy: WFL for tasks assessed/performed Overall Cognitive Status: Within Functional Limits for tasks assessed                      Exercises Other Exercises Other Exercises: 10+ min working on knee/hip  flexion/ext ab/abd with resistance through femur.  Emphasis on relaxing into flexion and extended knee..  Left pt with heel on pillow for 15 minutes to draw R knee down into extension.    General Comments General comments (skin integrity, edema, etc.): Educated on elevation and gentle Heel Cord stretch.      Pertinent Vitals/Pain Pain Assessment: Faces Pain Score: 10-Worst pain ever Faces Pain Scale: Hurts even more Pain Location: RLE Pain Descriptors / Indicators: Throbbing;Sore Pain Intervention(s): Monitored during session    Home Living                      Prior Function            PT Goals (current goals can now be found in the care plan section) Acute Rehab PT Goals Patient Stated Goal: home today PT Goal Formulation: With patient Time For Goal Achievement: 06/12/16 Potential to Achieve Goals: Fair Progress towards PT goals: Progressing toward goals    Frequency  Min 3X/week    PT Plan Equipment recommendations need to be updated;Discharge plan needs to be updated  Co-evaluation             End of Session   Activity Tolerance: Patient limited by pain Patient left: in bed;with call bell/phone within reach     Time: 1158-1220 PT Time Calculation (min) (ACUTE ONLY): 22 min  Charges:  $Therapeutic Exercise: 8-22 mins                    G Codes:      Roberto Bates, Eliseo Gum 06/02/2016, 12:31 PM  06/02/2016  Mustang Bing, PT [phone]  (pager)

## 2016-06-02 NOTE — Progress Notes (Signed)
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   OT comments  Pt continues to c/o significant pain limiting functional mobility this session. Pt agreeable to perform transfers in preparation for d/c home today. Pt requires min guard assist overall for basic stand pivot transfers to [facility]. Pt able to demo good understanding of NWB status and elevation of RLE. Pt planning to d/c home today with supervision from family; therefore updated d/c recommendations to [facility] for follow up. Will continue to follow acutely.   Follow Up Recommendations  Home health OT;Supervision/Assistance - 24 hour    Equipment Recommendations  3 in 1 bedside comode;Tub/shower bench    Recommendations for Other Services      Precautions / Restrictions Precautions Precautions: Fall Restrictions Weight Bearing Restrictions: Yes RLE Weight Bearing: Non weight bearing       Mobility Bed Mobility Overal bed mobility: Needs Assistance Bed Mobility: Supine to Sit;Sit to Supine     Supine to sit: Min assist Sit to supine: Min assist   General bed mobility comments: Min assist to manage RLE to EOB and back into bed. Required assist for positioning of RLE.  Transfers Overall transfer level: Needs assistance Equipment used: Rolling walker (2 wheeled) Transfers: Sit to/from [employer] Sit to Stand: Min guard Stand pivot transfers: Min guard       General transfer comment: Good hand placement and technique. Able to maintain NWB on RLE thrhoughout.    Balance Overall balance assessment: Needs assistance Sitting-balance support: Feet supported;No upper extremity supported Sitting balance-Leahy Scale: Good     Standing balance support: Bilateral upper  extremity supported;During functional activity Standing balance-Leahy Scale: Poor Standing balance comment: RW for support.                   ADL Overall ADL's : Needs assistance/impaired                     Lower Body Dressing: Minimal assistance;Bed level Lower Body Dressing Details (indicate cue type and reason): to don sock. Educated on LB dressing technique with assist from wife. Toilet Transfer: Min Probation officer Details (indicate cue type and reason): Pt able to perform stand pivot transfer to [facility] with min guard assist. Educated on use of 3 in 1 next to bed for safety.         Functional mobility during ADLs: Min guard;Rolling walker (for stand pivot only) General ADL Comments: Pt c/o significant pain in RLE, especially during functional mobility. Educated pt on home safety, sponge bathing with wife assist, stand pivot transfers to [facility] vs use of bed pan. Pt able to teach back importance of elevation of RLE and able to maintain NWB on RLE throughout all activities.      Vision                     Perception     Praxis      Cognition   Behavior During Therapy: [facility] for tasks assessed/performed Overall Cognitive Status: Within Functional Limits for tasks assessed                       Extremity/Trunk Assessment  Exercises     Shoulder Instructions       General Comments      Pertinent Vitals/ Pain       Pain Assessment: 0-10 Pain Score: 10-Worst pain ever Pain Location: RLE Pain Descriptors / Indicators: Aching;Grimacing;Guarding Pain Intervention(s): Limited activity within patient's tolerance;Monitored during session;Premedicated before session  Home Living                                          Prior Functioning/Environment              Frequency Min 3X/week     Progress Toward Goals  OT Goals(current goals can now be found in the care plan  section)  Progress towards OT goals: Progressing toward goals  Acute Rehab OT Goals Patient Stated Goal: home today OT Goal Formulation: With patient  Plan Discharge plan needs to be updated    Co-evaluation                 End of Session Equipment Utilized During Treatment: Rolling walker   Activity Tolerance Patient limited by pain   Patient Left in bed;with call bell/phone within reach;with bed alarm set   Nurse Communication          Time: [phone] OT Time Calculation (min): 16 min  Charges: OT General Charges $OT Visit: 1 Procedure OT Treatments $Self Care/Home Management : 8-22 mins  Gaye Alken M.S., OTR/L Pager: [phone]  06/02/2016, 10:48 AM

## 2016-06-03 ENCOUNTER — Encounter (HOSPITAL_COMMUNITY): Payer: Self-pay | Admitting: Emergency Medicine

## 2016-06-05 ENCOUNTER — Encounter (HOSPITAL_COMMUNITY): Payer: Self-pay | Admitting: Orthopedic Surgery

## 2016-06-05 NOTE — OR Nursing (Signed)
Late entry due to surgery start correction for clerical error.

## 2016-06-09 NOTE — Discharge Summary (Signed)
Physician Discharge Summary  Patient ID: Roberto Bates MRN: [id number] DOB/AGE: Jul 13, 1976 40 y.o.  Admit date: 05/28/2016 Discharge date: 06/09/2016  Admission Diagnoses:  Tibia Fracture  Discharge Diagnoses:  Active Problems:   Tibia fracture   S/p tibial fracture   AKI (acute kidney injury) (HCC)   Leukocytosis   History reviewed. No pertinent past medical history.  Surgeries: Procedure(s): INTRAMEDULLARY (IM) NAIL TIBIAL IRRIGATION AND DEBRIDEMENT OF OPEN FRACTURE RIGHT TIBIA on 05/28/2016   Consultants (if any): Treatment Team:  Venita Lick, MD  Discharged Condition: Improved  Hospital Course: Roberto Bates is an 40 y.o. male who was admitted 05/28/2016 with a diagnosis of <principal problem not specified> and went to the operating room on 05/28/2016 and underwent the above named procedures.  Pt discharged on 06/02/16.  He was given perioperative antibiotics:  Anti-infectives    Start     Dose/Rate Route Frequency Ordered Stop   05/29/16 0200  ceFAZolin (ANCEF) IVPB 2g/100 mL premix     2 g 200 mL/hr over 30 Minutes Intravenous Every 6 hours 05/28/16 2334 05/29/16 1441   05/28/16 1645  ceFAZolin (ANCEF) IVPB 1 g/50 mL premix     1 g 100 mL/hr over 30 Minutes Intravenous  Once 05/28/16 1635 05/28/16 1742    .  He was given sequential compression devices, early ambulation, and TED for DVT prophylaxis.  He benefited maximally from the hospital stay and there were no complications.    Recent vital signs:  Vitals:   06/02/16 1331 06/02/16 1739  BP: 129/75 137/79  Pulse: 96 96  Resp: 18 20  Temp: 99.3 F (37.4 C) 98.4 F (36.9 C)    Recent laboratory studies:  Lab Results  Component Value Date   HGB 15.7 05/28/2016   HGB 16.6 02/23/2013   Lab Results  Component Value Date   WBC 11.0 (H) 05/28/2016   PLT 200 05/28/2016   Lab Results  Component Value Date   INR 1.03 05/28/2016   Lab Results  Component Value Date   NA 139 05/28/2016   K 3.9  05/28/2016   CL 111 05/28/2016   CO2 23 05/28/2016   BUN 21 (H) 05/28/2016   CREATININE 1.34 (H) 05/28/2016   GLUCOSE 127 (H) 05/28/2016    Discharge Medications:     Medication List    STOP taking these medications   multivitamin with minerals Tabs tablet     TAKE these medications   aspirin 81 MG chewable tablet Chew 4 tablets (324 mg total) by mouth once.   methocarbamol 500 MG tablet Commonly known as:  ROBAXIN Take 1 tablet (500 mg total) by mouth 3 (three) times daily as needed for muscle spasms.   oxyCODONE-acetaminophen 10-325 MG tablet Commonly known as:  PERCOCET Take 1 tablet by mouth every 4 (four) hours as needed for pain.       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 Initial encounter. EXAM: LEFT TIBIA AND FIBULA - 2 VIEW COMPARISON:  None. FINDINGS: No acute bony or joint abnormality is identified. Well corticated osseous fragment off the medial talus is compatible with old trauma. Soft tissues are unremarkable. IMPRESSION: No acute abnormality. Electronically Signed   By: Drusilla Kanner M.D.   On: 05/28/2016 17:07   Dg Tibia/fibula Right  Result Date: 05/28/2016 CLINICAL DATA:  Right intra medullary tibial nail placement. EXAM: DG C-ARM 61-120 MIN; RIGHT TIBIA AND FIBULA - 2 VIEW COMPARISON:  Lower extremity radiograph 05/28/2016 FINDINGS: Fluoroscopic images demonstrate placement of intra medullary nail within the right tibia, affixing proximal right tibial fracture. The fracture has been reduced, with bony fragments in near anatomic alignment. There is no evidence of new fractures or immediate hardware complications. Fluoroscopy time is recorded as 5 minutes. IMPRESSION: Intra medullary nail fixation of proximal right tibial fracture with near anatomic alignment of the fracture fragments and no evidence of immediate complications. Electronically Signed   By:  Ted Mcalpine M.D.   On: 05/28/2016 21:57   Dg Tibia/fibula Right  Result Date: 05/28/2016 CLINICAL DATA:  Injured at work today. EXAM: RIGHT TIBIA AND FIBULA - 2 VIEW COMPARISON:  None. FINDINGS: There is a mildly displaced transverse fracture involving the upper tibial shaft. No fibular fracture is identified. The knee and ankle joints are intact. IMPRESSION: Mild posterior displacement of a proximal tibial shaft fracture. Electronically Signed   By: Rudie Meyer M.D.   On: 05/28/2016 17:08   Dg Chest Portable 1 View  Result Date: 05/28/2016 CLINICAL DATA:  Injured today at work. EXAM: PORTABLE CHEST 1 VIEW COMPARISON:  None. FINDINGS: The cardiac silhouette, mediastinal and hilar contours are within normal limits given the AP projection and low lung volumes. No acute pulmonary findings. The bony thorax is intact. IMPRESSION: No acute cardiopulmonary findings. Electronically Signed   By: Rudie Meyer M.D.   On: 05/28/2016 17:06   Dg Knee Right Port  Result Date: 05/28/2016 CLINICAL DATA:  Intra medullary rod fixation of proximal tibial fracture. EXAM: PORTABLE RIGHT KNEE - 1-2 VIEW COMPARISON:  None. FINDINGS: There has been a placement of intra medullary rod affixing proximal tibial fracture with near anatomic alignment of the fracture fragments. There are expected postsurgical soft tissue changes. A rounded calcific density is seen adjacent to the lateral aspect of the patella with uncertain etiology. IMPRESSION: Intra medullary rod fixation of proximal right tibial fracture with near anatomic alignment and no evidence of immediate complications within the visualized portion of the surgical site. 2.7 cm rounded calcific density adjacent to the lateral patella with uncertain etiology. Dedicated knee radiographs may be considered. Electronically Signed   By: Ted Mcalpine M.D.   On: 05/28/2016 22:42   Dg C-arm 61-120 Min  Result Date: 05/28/2016 CLINICAL DATA:  Right intra medullary  tibial nail placement. EXAM: DG C-ARM 61-120 MIN; RIGHT TIBIA AND FIBULA - 2 VIEW COMPARISON:  Lower extremity radiograph 05/28/2016 FINDINGS: Fluoroscopic images demonstrate placement of intra medullary nail within the right tibia, affixing proximal right tibial fracture. The fracture has been reduced, with bony fragments in near anatomic alignment. There is no evidence of new fractures or immediate hardware complications. Fluoroscopy time is recorded as 5 minutes. IMPRESSION: Intra medullary nail fixation of proximal right tibial fracture with near anatomic alignment of the fracture fragments and no evidence of immediate complications. Electronically Signed   By: Ted Mcalpine M.D.   On: 05/28/2016 21:57    Disposition: 01-Home or Self Care    Follow-up Information    BROOKS,DAHARI D, MD. Schedule an appointment as soon as possible for a visit in 2 week(s).   Specialty:  Orthopedic Surgery Why:  For suture removal, For wound re-check, If symptoms worsen Contact information: [address] [place] [address] [phone]            Signed: Kirt Boys 06/09/2016, 2:54 PM

## 2016-06-16 ENCOUNTER — Encounter (INDEPENDENT_AMBULATORY_CARE_PROVIDER_SITE_OTHER): Payer: Self-pay

## 2016-06-16 ENCOUNTER — Ambulatory Visit (HOSPITAL_COMMUNITY)
Admission: RE | Admit: 2016-06-16 | Discharge: 2016-06-16 | Disposition: A | Discharge status: Home / Self Care | Payer: Worker's Compensation | Source: Ambulatory Visit | Attending: Cardiovascular Disease | Admitting: Cardiovascular Disease

## 2016-06-16 ENCOUNTER — Other Ambulatory Visit (HOSPITAL_COMMUNITY): Payer: Self-pay | Admitting: Orthopedic Surgery

## 2016-06-16 DIAGNOSIS — I82441 Acute embolism and thrombosis of right tibial vein: Secondary | ICD-10-CM | POA: Insufficient documentation

## 2016-06-16 DIAGNOSIS — M7989 Other specified soft tissue disorders: Secondary | ICD-10-CM | POA: Diagnosis not present

## 2016-06-16 DIAGNOSIS — I82401 Acute embolism and thrombosis of unspecified deep veins of right lower extremity: Secondary | ICD-10-CM | POA: Diagnosis not present

## 2016-06-16 DIAGNOSIS — M79604 Pain in right leg: Secondary | ICD-10-CM | POA: Insufficient documentation

## 2016-09-07 ENCOUNTER — Encounter (HOSPITAL_COMMUNITY): Payer: Self-pay | Admitting: *Deleted

## 2016-09-07 NOTE — Progress Notes (Signed)
Gwen returned my call and she states pt is to take Xarelto today, but not tomorrow. I called pt and verified that he has taken his dose of Xarelto today and re-iterated that he is NOT to take it tomorrow. He voiced understanding.

## 2016-09-07 NOTE — H&P (Signed)
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 Treated with IMN. Complications include DVT to R leg (dx 4 weeks after surgery) and nonunion of fracture. Pt presents today for removal of current hardware and placement of new nail. No current concern for deep infection although pts injury was an open fracture.   Pt remains on crutches due to pain with ambulation. He notes primarily deep aching pain in his right tibia.   Past Medical History:  Diagnosis Date  . DVT (deep venous thrombosis) (HCC)    right leg     Past Surgical History:  Procedure Laterality Date  . APPENDECTOMY    . CHOLECYSTECTOMY    . I&D EXTREMITY Right 05/28/2016   Procedure: IRRIGATION AND DEBRIDEMENT OF OPEN FRACTURE RIGHT TIBIA;  Surgeon: Venita Lickahari Brooks, MD;  Location: [facility] OR;  Service: Orthopedics;  Laterality: Right;  . TIBIA IM NAIL INSERTION Right 05/28/2016   Procedure: INTRAMEDULLARY (IM) NAIL TIBIAL;  Surgeon: Venita Lickahari Brooks, MD;  Location: [facility] OR;  Service: Orthopedics;  Laterality: Right;    Family History  Problem Relation Age of Onset  . Diabetes Mother    Social History:  reports that he has never smoked. He has never used smokeless tobacco. He reports that he drinks alcohol. He reports that he does not use drugs.  Allergies:  Allergies  Allergen Reactions  . No Known Allergies     Medications Prior to Admission  Medication Sig Dispense Refill  . rivaroxaban (XARELTO) 20 MG TABS tablet Take 20 mg by mouth daily.    Marland Kitchen. aspirin 81 MG chewable tablet Chew 4 tablets (324 mg total) by mouth once. (Patient not taking: Reported on 09/04/2016) 30 tablet 2  . methocarbamol (ROBAXIN) 500 MG tablet Take 1 tablet (500 mg total) by mouth 3 (three) times daily as needed for muscle spasms. (Patient not taking: Reported on 09/04/2016) 60 tablet 0  . oxyCODONE-acetaminophen (PERCOCET)  10-325 MG tablet Take 1 tablet by mouth every 4 (four) hours as needed for pain. (Patient not taking: Reported on 09/04/2016) 60 tablet 0    Labs pending   Review of Systems  Constitutional: Negative for chills, fever and malaise/fatigue.  Eyes: Negative for blurred vision.  Respiratory: Negative for shortness of breath and wheezing.   Cardiovascular: Negative for chest pain and palpitations.  Gastrointestinal: Negative for abdominal pain, nausea and vomiting.  Musculoskeletal:       R tibia pain Pain with ambulation   Neurological: Negative for tingling, sensory change and headaches.     Vitals on arrival to short stay  Physical Exam  Constitutional: He appears well-developed and well-nourished. He is cooperative.  Athletic build male   Cardiovascular: Normal rate, regular rhythm, S1 normal and S2 normal.   Respiratory:  CTA B   Musculoskeletal:  Right Lower Extremity   full ROM R knee and ankle  TTP over locking bolts  surgical wounds well healed  traumatic wound healed, no erythema or fluctuance   + TTP over nonunion site  stable varus and valgus exam of R knee at full extension and 30 degrees of flexion    Ext warm    + DP pulse   DPN, SPN, TN sensation intact   EHL, FHL, AT, PT, peroneals, gastroc motor intact    Swelling controlled   Neurological: He is alert.  Psychiatric: He has a normal mood and  affect. Cognition and memory are normal.     Assessment/Plan  40 y/o male s/p IMN open R tibia fracture from forklift accident with tibia nonunion, 3 months s/p surgery   [facility] (exchange nailing), + blocking screw Do not anticipate autografting  Will allow Partial WB post op  Hold abx preop for intra-op cultures Will likely dc home in am  Admit overnight for pain control and therapies in am  Risks and benefits reviewed with pt. He wishes to proceed   Mearl LatinUL,Elayne Gruver W, PA-C 09/08/2016, 8:08 AM

## 2016-09-07 NOTE — Progress Notes (Signed)
Spoke with pt for pre-op call. Pt denies cardiac history, chest pain or sob. Pt is on Xarelto and has been on it since his surgery in July. I asked if he had been told to hold it prior to surgery and he stated that he had not. I called Dr. Magdalene PatriciaHandy's office and [facility], asking if pt was supposed to hold it. She states she will find out and let me know.

## 2016-09-08 ENCOUNTER — Ambulatory Visit (HOSPITAL_COMMUNITY): Payer: Worker's Compensation | Admitting: Anesthesiology

## 2016-09-08 ENCOUNTER — Ambulatory Visit (HOSPITAL_COMMUNITY): Payer: Worker's Compensation

## 2016-09-08 ENCOUNTER — Encounter (HOSPITAL_COMMUNITY): Payer: Self-pay | Admitting: Orthopedic Surgery

## 2016-09-08 ENCOUNTER — Ambulatory Visit (HOSPITAL_COMMUNITY)
Admission: AD | Admit: 2016-09-08 | Discharge: 2016-09-09 | Disposition: A | Discharge status: Home / Self Care | Payer: Worker's Compensation | Source: Ambulatory Visit | Attending: Orthopedic Surgery | Admitting: Orthopedic Surgery

## 2016-09-08 ENCOUNTER — Encounter (HOSPITAL_COMMUNITY)
Admission: AD | Disposition: A | Discharge status: Home / Self Care | Payer: Self-pay | Source: Ambulatory Visit | Attending: Orthopedic Surgery

## 2016-09-08 DIAGNOSIS — S82201M Unspecified fracture of shaft of right tibia, subsequent encounter for open fracture type I or II with nonunion: Secondary | ICD-10-CM | POA: Diagnosis present

## 2016-09-08 DIAGNOSIS — Z86718 Personal history of other venous thrombosis and embolism: Secondary | ICD-10-CM | POA: Insufficient documentation

## 2016-09-08 DIAGNOSIS — S82291M Other fracture of shaft of right tibia, subsequent encounter for open fracture type I or II with nonunion: Secondary | ICD-10-CM | POA: Insufficient documentation

## 2016-09-08 DIAGNOSIS — Y838 Other surgical procedures as the cause of abnormal reaction of the patient, or of later complication, without mention of misadventure at the time of the procedure: Secondary | ICD-10-CM | POA: Diagnosis not present

## 2016-09-08 DIAGNOSIS — E559 Vitamin D deficiency, unspecified: Secondary | ICD-10-CM | POA: Diagnosis present

## 2016-09-08 DIAGNOSIS — Z833 Family history of diabetes mellitus: Secondary | ICD-10-CM | POA: Diagnosis not present

## 2016-09-08 DIAGNOSIS — Y793 Surgical instruments, materials and orthopedic devices (including sutures) associated with adverse incidents: Secondary | ICD-10-CM | POA: Diagnosis not present

## 2016-09-08 DIAGNOSIS — T84126D Displacement of internal fixation device of bone of right lower leg, subsequent encounter: Secondary | ICD-10-CM | POA: Diagnosis not present

## 2016-09-08 DIAGNOSIS — X58XXXD Exposure to other specified factors, subsequent encounter: Secondary | ICD-10-CM | POA: Insufficient documentation

## 2016-09-08 DIAGNOSIS — S82401M Unspecified fracture of shaft of right fibula, subsequent encounter for open fracture type I or II with nonunion: Secondary | ICD-10-CM

## 2016-09-08 DIAGNOSIS — Z7982 Long term (current) use of aspirin: Secondary | ICD-10-CM | POA: Diagnosis not present

## 2016-09-08 DIAGNOSIS — Z419 Encounter for procedure for purposes other than remedying health state, unspecified: Secondary | ICD-10-CM

## 2016-09-08 HISTORY — PX: HARDWARE REMOVAL: SHX979

## 2016-09-08 HISTORY — PX: TIBIA IM NAIL INSERTION: SHX2516

## 2016-09-08 HISTORY — DX: Acute embolism and thrombosis of unspecified deep veins of unspecified lower extremity: I82.409

## 2016-09-08 LAB — COMPREHENSIVE METABOLIC PANEL
ALBUMIN: 4.1 g/dL (ref 3.5–5.0)
ALK PHOS: 66 U/L (ref 38–126)
ALT: 30 U/L (ref 17–63)
AST: 22 U/L (ref 15–41)
Anion gap: 8 (ref 5–15)
BILIRUBIN TOTAL: 0.9 mg/dL (ref 0.3–1.2)
BUN: 20 mg/dL (ref 6–20)
CALCIUM: 9.2 mg/dL (ref 8.9–10.3)
CO2: 22 mmol/L (ref 22–32)
CREATININE: 1.2 mg/dL (ref 0.61–1.24)
Chloride: 108 mmol/L (ref 101–111)
GFR calc Af Amer: >60 mL/min (ref >60)
Glucose, Bld: 105 mg/dL — ABNORMAL HIGH (ref 65–99)
Potassium: 3.8 mmol/L (ref 3.5–5.1)
SODIUM: 138 mmol/L (ref 135–145)
TOTAL PROTEIN: 7.2 g/dL (ref 6.5–8.1)

## 2016-09-08 LAB — URINALYSIS, ROUTINE W REFLEX MICROSCOPIC
BILIRUBIN URINE: NEGATIVE
GLUCOSE, UA: NEGATIVE mg/dL
Hgb urine dipstick: NEGATIVE
Ketones, ur: 15 mg/dL — AB
Leukocytes, UA: NEGATIVE
NITRITE: NEGATIVE
PH: 6 (ref 5.0–8.0)
Protein, ur: NEGATIVE mg/dL
SPECIFIC GRAVITY, URINE: 1.027 (ref 1.005–1.030)

## 2016-09-08 LAB — ABO/RH: ABO/RH(D): O POS

## 2016-09-08 LAB — CBC WITH DIFFERENTIAL/PLATELET
BASOS PCT: 1 %
Basophils Absolute: 0 10*3/uL (ref 0.0–0.1)
EOS ABS: 0.3 10*3/uL (ref 0.0–0.7)
Eosinophils Relative: 5 %
HCT: 47.8 % (ref 39.0–52.0)
Hemoglobin: 16.3 g/dL (ref 13.0–17.0)
Lymphocytes Relative: 43 %
Lymphs Abs: 3 10*3/uL (ref 0.7–4.0)
MCH: 26.9 pg (ref 26.0–34.0)
MCHC: 34.1 g/dL (ref 30.0–36.0)
MCV: 78.7 fL (ref 78.0–100.0)
MONO ABS: 0.5 10*3/uL (ref 0.1–1.0)
MONOS PCT: 7 %
NEUTROS PCT: 44 %
Neutro Abs: 3 10*3/uL (ref 1.7–7.7)
PLATELETS: 177 10*3/uL (ref 150–400)
RBC: 6.07 MIL/uL — ABNORMAL HIGH (ref 4.22–5.81)
RDW: 13.1 % (ref 11.5–15.5)
WBC: 6.9 10*3/uL (ref 4.0–10.5)

## 2016-09-08 LAB — TYPE AND SCREEN
ABO/RH(D): O POS
ANTIBODY SCREEN: NEGATIVE

## 2016-09-08 LAB — PROTIME-INR
INR: 1.09
PROTHROMBIN TIME: 14.2 s (ref 11.4–15.2)

## 2016-09-08 LAB — APTT: aPTT: 33 seconds (ref 24–36)

## 2016-09-08 LAB — SEDIMENTATION RATE: SED RATE: 0 mm/h (ref 0–16)

## 2016-09-08 LAB — C-REACTIVE PROTEIN

## 2016-09-08 SURGERY — INSERTION, INTRAMEDULLARY ROD, TIBIA
Anesthesia: General | Laterality: Right

## 2016-09-08 MED ORDER — CEFAZOLIN IN D5W 1 GM/50ML IV SOLN
1.0000 g | Freq: Four times a day (QID) | INTRAVENOUS | Status: AC
  Administered 2016-09-08 – 2016-09-09 (×3): 1 g via INTRAVENOUS
  Filled 2016-09-08 (×4): qty 50

## 2016-09-08 MED ORDER — ONDANSETRON HCL 4 MG/2ML IJ SOLN
4.0000 mg | Freq: Four times a day (QID) | INTRAMUSCULAR | Status: DC | PRN
  Administered 2016-09-09: 4 mg via INTRAVENOUS
  Filled 2016-09-08: qty 2

## 2016-09-08 MED ORDER — 0.9 % SODIUM CHLORIDE (POUR BTL) OPTIME
TOPICAL | Status: DC | PRN
  Administered 2016-09-08 (×2): 1000 mL

## 2016-09-08 MED ORDER — HYDROMORPHONE HCL 1 MG/ML IJ SOLN
INTRAMUSCULAR | Status: AC
  Filled 2016-09-08: qty 1

## 2016-09-08 MED ORDER — POTASSIUM CHLORIDE IN NACL 20-0.9 MEQ/L-% IV SOLN
INTRAVENOUS | Status: DC
  Administered 2016-09-08: 15:00:00 via INTRAVENOUS
  Filled 2016-09-08: qty 1000

## 2016-09-08 MED ORDER — CHLORHEXIDINE GLUCONATE 4 % EX LIQD: 60.0000 mL | Freq: Once | CUTANEOUS | Status: DC

## 2016-09-08 MED ORDER — FENTANYL CITRATE (PF) 100 MCG/2ML IJ SOLN
INTRAMUSCULAR | Status: DC | PRN
  Administered 2016-09-08: 50 ug via INTRAVENOUS
  Administered 2016-09-08: 100 ug via INTRAVENOUS
  Administered 2016-09-08: 50 ug via INTRAVENOUS

## 2016-09-08 MED ORDER — ACETAMINOPHEN 325 MG PO TABS
650.0000 mg | ORAL_TABLET | Freq: Four times a day (QID) | ORAL | Status: DC | PRN
  Administered 2016-09-09: 650 mg via ORAL
  Filled 2016-09-08: qty 2

## 2016-09-08 MED ORDER — ALUM & MAG HYDROXIDE-SIMETH 200-200-20 MG/5ML PO SUSP
30.0000 mL | ORAL | Status: DC | PRN
  Administered 2016-09-08: 30 mL via ORAL
  Filled 2016-09-08: qty 30

## 2016-09-08 MED ORDER — MEPERIDINE HCL 25 MG/ML IJ SOLN
INTRAMUSCULAR | Status: AC
  Filled 2016-09-08: qty 1

## 2016-09-08 MED ORDER — MIDAZOLAM HCL 2 MG/2ML IJ SOLN
INTRAMUSCULAR | Status: AC
  Filled 2016-09-08: qty 2

## 2016-09-08 MED ORDER — OXYCODONE HCL 5 MG PO TABS
ORAL_TABLET | ORAL | Status: AC
  Filled 2016-09-08: qty 2

## 2016-09-08 MED ORDER — DOCUSATE SODIUM 100 MG PO CAPS
100.0000 mg | ORAL_CAPSULE | Freq: Two times a day (BID) | ORAL | Status: DC
  Administered 2016-09-08 – 2016-09-09 (×2): 100 mg via ORAL
  Filled 2016-09-08 (×2): qty 1

## 2016-09-08 MED ORDER — ROCURONIUM BROMIDE 100 MG/10ML IV SOLN
INTRAVENOUS | Status: DC | PRN
  Administered 2016-09-08: 30 mg via INTRAVENOUS
  Administered 2016-09-08: 50 mg via INTRAVENOUS

## 2016-09-08 MED ORDER — LIDOCAINE HCL (CARDIAC) 20 MG/ML IV SOLN
INTRAVENOUS | Status: DC | PRN
  Administered 2016-09-08: 80 mg via INTRAVENOUS

## 2016-09-08 MED ORDER — LACTATED RINGERS IV SOLN
INTRAVENOUS | Status: DC
  Administered 2016-09-08 (×2): via INTRAVENOUS

## 2016-09-08 MED ORDER — ACETAMINOPHEN 650 MG RE SUPP: 650.0000 mg | Freq: Four times a day (QID) | RECTAL | Status: DC | PRN

## 2016-09-08 MED ORDER — FENTANYL CITRATE (PF) 100 MCG/2ML IJ SOLN
INTRAMUSCULAR | Status: AC
  Filled 2016-09-08: qty 2

## 2016-09-08 MED ORDER — HYDROMORPHONE HCL 2 MG/ML IJ SOLN
1.0000 mg | INTRAMUSCULAR | Status: DC | PRN
  Administered 2016-09-08 – 2016-09-09 (×3): 2 mg via INTRAVENOUS
  Filled 2016-09-08 (×3): qty 1

## 2016-09-08 MED ORDER — HYDROMORPHONE HCL 1 MG/ML IJ SOLN
0.2500 mg | INTRAMUSCULAR | Status: DC | PRN
  Administered 2016-09-08 (×3): 0.5 mg via INTRAVENOUS

## 2016-09-08 MED ORDER — GLYCOPYRROLATE 0.2 MG/ML IJ SOLN
INTRAMUSCULAR | Status: DC | PRN
  Administered 2016-09-08: 0.4 mg via INTRAVENOUS

## 2016-09-08 MED ORDER — ONDANSETRON HCL 4 MG/2ML IJ SOLN
INTRAMUSCULAR | Status: DC | PRN
  Administered 2016-09-08: 4 mg via INTRAVENOUS

## 2016-09-08 MED ORDER — PROPOFOL 10 MG/ML IV BOLUS
INTRAVENOUS | Status: AC
  Filled 2016-09-08: qty 20

## 2016-09-08 MED ORDER — RIVAROXABAN 20 MG PO TABS
20.0000 mg | ORAL_TABLET | Freq: Every day | ORAL | Status: DC
  Administered 2016-09-09: 20 mg via ORAL
  Filled 2016-09-08 (×3): qty 1

## 2016-09-08 MED ORDER — PROPOFOL 10 MG/ML IV BOLUS
INTRAVENOUS | Status: DC | PRN
  Administered 2016-09-08: 200 mg via INTRAVENOUS

## 2016-09-08 MED ORDER — ONDANSETRON HCL 4 MG PO TABS: 4.0000 mg | ORAL_TABLET | Freq: Four times a day (QID) | ORAL | Status: DC | PRN

## 2016-09-08 MED ORDER — PROMETHAZINE HCL 25 MG/ML IJ SOLN: 6.2500 mg | INTRAMUSCULAR | Status: DC | PRN

## 2016-09-08 MED ORDER — METOCLOPRAMIDE HCL 5 MG/ML IJ SOLN
5.0000 mg | Freq: Three times a day (TID) | INTRAMUSCULAR | Status: DC | PRN
Start: 2016-09-08 — End: 2016-09-09

## 2016-09-08 MED ORDER — MIDAZOLAM HCL 5 MG/5ML IJ SOLN
INTRAMUSCULAR | Status: DC | PRN
  Administered 2016-09-08 (×2): 2 mg via INTRAVENOUS

## 2016-09-08 MED ORDER — OXYCODONE HCL 5 MG PO TABS
5.0000 mg | ORAL_TABLET | ORAL | Status: DC | PRN
  Administered 2016-09-08 – 2016-09-09 (×3): 10 mg via ORAL
  Filled 2016-09-08 (×2): qty 2

## 2016-09-08 MED ORDER — HYDROCODONE-ACETAMINOPHEN 7.5-325 MG PO TABS
1.0000 | ORAL_TABLET | Freq: Four times a day (QID) | ORAL | Status: DC | PRN
  Administered 2016-09-09 (×2): 2 via ORAL
  Filled 2016-09-08 (×2): qty 2

## 2016-09-08 MED ORDER — NEOSTIGMINE METHYLSULFATE 10 MG/10ML IV SOLN
INTRAVENOUS | Status: DC | PRN
  Administered 2016-09-08: 3 mg via INTRAVENOUS

## 2016-09-08 MED ORDER — CEFAZOLIN SODIUM-DEXTROSE 2-4 GM/100ML-% IV SOLN
2.0000 g | INTRAVENOUS | Status: AC
  Administered 2016-09-08: 2 g via INTRAVENOUS
  Filled 2016-09-08: qty 100

## 2016-09-08 MED ORDER — METOCLOPRAMIDE HCL 5 MG PO TABS: 5.0000 mg | ORAL_TABLET | Freq: Three times a day (TID) | ORAL | Status: DC | PRN

## 2016-09-08 MED ORDER — MEPERIDINE HCL 25 MG/ML IJ SOLN
6.2500 mg | INTRAMUSCULAR | Status: DC | PRN
  Administered 2016-09-08: 12.5 mg via INTRAVENOUS

## 2016-09-08 MED ORDER — HYDROMORPHONE HCL 2 MG/ML IJ SOLN
INTRAMUSCULAR | Status: AC
  Administered 2016-09-08: 0.5 mg
  Filled 2016-09-08: qty 1

## 2016-09-08 MED ORDER — FENTANYL CITRATE (PF) 100 MCG/2ML IJ SOLN
INTRAMUSCULAR | Status: AC
  Filled 2016-09-08: qty 4

## 2016-09-08 MED ORDER — HYDROMORPHONE HCL 1 MG/ML IJ SOLN
INTRAMUSCULAR | Status: DC | PRN
  Administered 2016-09-08 (×2): 1 mg via INTRAVENOUS

## 2016-09-08 SURGICAL SUPPLY — 74 items
BANDAGE ACE 4X5 VEL STRL LF (GAUZE/BANDAGES/DRESSINGS) ×3 IMPLANT
BANDAGE ACE 6X5 VEL STRL LF (GAUZE/BANDAGES/DRESSINGS) ×3 IMPLANT
BANDAGE ESMARK 6X9 LF (GAUZE/BANDAGES/DRESSINGS) IMPLANT
BIT DRILL 2.9X70 QC CALB (BIT) ×3 IMPLANT
BIT DRILL 3.8X6 NS (BIT) ×3 IMPLANT
BIT DRILL 4.4 NS (BIT) ×3 IMPLANT
BNDG COHESIVE 6X5 TAN STRL LF (GAUZE/BANDAGES/DRESSINGS) ×3 IMPLANT
BNDG ESMARK 6X9 LF (GAUZE/BANDAGES/DRESSINGS)
BNDG GAUZE ELAST 4 BULKY (GAUZE/BANDAGES/DRESSINGS) ×3 IMPLANT
BRUSH SCRUB DISP (MISCELLANEOUS) ×6 IMPLANT
CLOSURE WOUND 1/2 X4 (GAUZE/BANDAGES/DRESSINGS) ×1
COVER SURGICAL LIGHT HANDLE (MISCELLANEOUS) ×3 IMPLANT
CUFF TOURNIQUET SINGLE 34IN LL (TOURNIQUET CUFF) ×3 IMPLANT
DRAPE C-ARM 42X72 X-RAY (DRAPES) ×3 IMPLANT
DRAPE C-ARMOR (DRAPES) ×3 IMPLANT
DRAPE HALF SHEET 40X57 (DRAPES) ×9 IMPLANT
DRAPE U-SHAPE 47X51 STRL (DRAPES) ×3 IMPLANT
DRSG ADAPTIC 3X8 NADH LF (GAUZE/BANDAGES/DRESSINGS) ×3 IMPLANT
DRSG MEPILEX BORDER 4X4 (GAUZE/BANDAGES/DRESSINGS) ×3 IMPLANT
ELECT REM PT RETURN 9FT ADLT (ELECTROSURGICAL) ×3
ELECTRODE REM PT RTRN 9FT ADLT (ELECTROSURGICAL) ×1 IMPLANT
GAUZE SPONGE 4X4 12PLY STRL (GAUZE/BANDAGES/DRESSINGS) ×3 IMPLANT
GLOVE BIO SURGEON STRL SZ7.5 (GLOVE) ×3 IMPLANT
GLOVE BIO SURGEON STRL SZ8 (GLOVE) ×3 IMPLANT
GLOVE BIO SURGEON STRL SZ8.5 (GLOVE) ×3 IMPLANT
GLOVE BIOGEL PI IND STRL 6.5 (GLOVE) ×1 IMPLANT
GLOVE BIOGEL PI IND STRL 7.5 (GLOVE) ×2 IMPLANT
GLOVE BIOGEL PI IND STRL 8 (GLOVE) ×1 IMPLANT
GLOVE BIOGEL PI INDICATOR 6.5 (GLOVE) ×2
GLOVE BIOGEL PI INDICATOR 7.5 (GLOVE) ×4
GLOVE BIOGEL PI INDICATOR 8 (GLOVE) ×2
GLOVE SURG SS PI 6.5 STRL IVOR (GLOVE) ×6 IMPLANT
GLOVE SURG SS PI 7.5 STRL IVOR (GLOVE) ×3 IMPLANT
GOWN STRL REUS W/ TWL LRG LVL3 (GOWN DISPOSABLE) ×2 IMPLANT
GOWN STRL REUS W/ TWL XL LVL3 (GOWN DISPOSABLE) ×1 IMPLANT
GOWN STRL REUS W/TWL LRG LVL3 (GOWN DISPOSABLE) ×4
GOWN STRL REUS W/TWL XL LVL3 (GOWN DISPOSABLE) ×2
GUIDEWIRE BALL NOSE 80CM (WIRE) ×3 IMPLANT
KIT BASIN OR (CUSTOM PROCEDURE TRAY) ×3 IMPLANT
KIT ROOM TURNOVER OR (KITS) ×3 IMPLANT
MANIFOLD NEPTUNE II (INSTRUMENTS) ×3 IMPLANT
NAIL TIBIAL 13MMX36CM (Nail) ×3 IMPLANT
NS IRRIG 1000ML POUR BTL (IV SOLUTION) ×3 IMPLANT
PACK GENERAL/GYN (CUSTOM PROCEDURE TRAY) ×3 IMPLANT
PAD ARMBOARD 7.5X6 YLW CONV (MISCELLANEOUS) ×6 IMPLANT
PADDING CAST COTTON 6X4 STRL (CAST SUPPLIES) ×9 IMPLANT
SCREW ACE CAP BN (Screw) ×2 IMPLANT
SCREW BN OBLQ FT 54X4.5XST 2 (Screw) ×1 IMPLANT
SCREW LAG CANC FT 4X42 (Screw) ×3 IMPLANT
SCREW NLOCK CANC HEX 4X32 (Screw) ×3 IMPLANT
SCREW NLOCK CANC HEX 4X36 (Screw) ×3 IMPLANT
SCREW NLOCK CANC HEX 4X40 (Screw) ×3 IMPLANT
SCREW PROXIMAL 4.5MMX18MM (Screw) ×3 IMPLANT
SCREW PROXIMAL DEPUY (Screw) ×2 IMPLANT
SCREW PRXML FT 65X5.5XNS CORT (Screw) ×1 IMPLANT
SPONGE GAUZE 4X4 12PLY STER LF (GAUZE/BANDAGES/DRESSINGS) ×3 IMPLANT
SPONGE LAP 18X18 X RAY DECT (DISPOSABLE) ×6 IMPLANT
SPONGE SCRUB IODOPHOR (GAUZE/BANDAGES/DRESSINGS) ×3 IMPLANT
STAPLER VISISTAT 35W (STAPLE) ×3 IMPLANT
STOCKINETTE IMPERVIOUS LG (DRAPES) ×3 IMPLANT
STRIP CLOSURE SKIN 1/2X4 (GAUZE/BANDAGES/DRESSINGS) ×2 IMPLANT
SUCTION FRAZIER HANDLE 10FR (MISCELLANEOUS)
SUCTION TUBE FRAZIER 10FR DISP (MISCELLANEOUS) IMPLANT
SUT ETHILON 3 0 PS 1 (SUTURE) ×9 IMPLANT
SUT PDS AB 2-0 CT1 27 (SUTURE) ×3 IMPLANT
SUT VIC AB 0 CT1 27 (SUTURE) ×2
SUT VIC AB 0 CT1 27XBRD ANBCTR (SUTURE) ×1 IMPLANT
SUT VIC AB 2-0 CT1 27 (SUTURE) ×4
SUT VIC AB 2-0 CT1 TAPERPNT 27 (SUTURE) ×2 IMPLANT
SUT VIC AB 2-0 CT3 27 (SUTURE) IMPLANT
TOWEL OR 17X24 6PK STRL BLUE (TOWEL DISPOSABLE) ×6 IMPLANT
TOWEL OR 17X26 10 PK STRL BLUE (TOWEL DISPOSABLE) ×6 IMPLANT
UNDERPAD 30X30 (UNDERPADS AND DIAPERS) ×3 IMPLANT
WATER STERILE IRR 1000ML POUR (IV SOLUTION) ×6 IMPLANT

## 2016-09-08 NOTE — Brief Op Note (Signed)
09/08/2016  1:41 PM  PATIENT:  Roberto Bates  40 y.o. male  PRE-OPERATIVE DIAGNOSIS:   1. RIGHT TIBIAL NONUNION 2. LOOSE TIBIAL NAIL  POST-OPERATIVE DIAGNOSIS:   1. RIGHT TIBIAL NONUNION 2. LOOSE TIBIAL NAIL  PROCEDURE:  Procedure(s): 1. INTRAMEDULLARY (IM) NAIL RIGHT TIBIA (Right) VERSANAIL 13mm x 360mm statically locked 2. REPAIR OF NONUNION 3. HARDWARE REMOVAL RIGHT TIBIA (Right)  SURGEON:  Surgeon(s) and Role:    * Myrene GalasMichael Yeiren Whitecotton, MD - Primary  PHYSICIAN ASSISTANT: Montez MoritaKEITH PAUL, PAC  ANESTHESIA:   general  EBL:  Total I/O In: 1600 [I.V.:1600] Out: 200 [Blood:200]  BLOOD ADMINISTERED:none  DRAINS: none   LOCAL MEDICATIONS USED:  NONE  SPECIMEN:  Source of Specimen:  Reamings  DISPOSITION OF SPECIMEN:  micro  COUNTS:  YES  TOURNIQUET:    DICTATION: .Other Dictation: Dictation Number [id number]  PLAN OF CARE: Admit to inpatient   PATIENT DISPOSITION:  PACU - hemodynamically stable.   Delay start of Pharmacological VTE agent (>24hrs) due to surgical blood loss or risk of bleeding: no

## 2016-09-08 NOTE — Anesthesia Preprocedure Evaluation (Addendum)
Anesthesia Evaluation  Patient identified by MRN, date of birth, ID band Patient awake    Reviewed: Allergy & Precautions, NPO status , Patient's Chart, lab work & pertinent test results  Airway Mallampati: II  TM Distance: >3 FB Neck ROM: Full    Dental no notable dental hx. (+) Teeth Intact,    Pulmonary neg pulmonary ROS,    Pulmonary exam normal breath sounds clear to auscultation       Cardiovascular negative cardio ROS Normal cardiovascular exam Rhythm:Regular Rate:Normal     Neuro/Psych negative neurological ROS  negative psych ROS   GI/Hepatic negative GI ROS, Neg liver ROS,   Endo/Other  negative endocrine ROS  Renal/GU negative Renal ROS  negative genitourinary   Musculoskeletal negative musculoskeletal ROS (+)   Abdominal   Peds negative pediatric ROS (+)  Hematology negative hematology ROS (+) Pt took last dose of Xarelto 24 hrs ago   Anesthesia Other Findings   Reproductive/Obstetrics negative OB ROS                            Anesthesia Physical  Anesthesia Plan  ASA: I  Anesthesia Plan: General   Post-op Pain Management:    Induction: Intravenous  Airway Management Planned: Oral ETT  Additional Equipment:   Intra-op Plan:   Post-operative Plan: Extubation in OR  Informed Consent: I have reviewed the patients History and Physical, chart, labs and discussed the procedure including the risks, benefits and alternatives for the proposed anesthesia with the patient or authorized representative who has indicated his/her understanding and acceptance.   Dental advisory given  Plan Discussed with: CRNA and Surgeon  Anesthesia Plan Comments: (Solids at 1100 am )        Anesthesia Quick Evaluation

## 2016-09-08 NOTE — Transfer of Care (Signed)
Immediate Anesthesia Transfer of Care Note  Patient: Roberto Bates  Procedure(s) Performed: Procedure(s): INTRAMEDULLARY (IM) NAIL RIGHT TIBIA (Right) HARDWARE REMOVAL RIGHT TIBIA (Right)  Patient Location: PACU  Anesthesia Type:General  Level of Consciousness: awake, alert  and oriented  Airway & Oxygen Therapy: Patient Spontanous Breathing  Post-op Assessment: Report given to RN and Post -op Vital signs reviewed and stable  Post vital signs: Reviewed and stable  Last Vitals:  Vitals:   09/08/16 0850 09/08/16 1359  BP: 114/70   Pulse: 64   Resp: 18   Temp: 36.8 C 36.4 C    Last Pain:  Vitals:   09/08/16 0850  TempSrc: Oral      Patients Stated Pain Goal: 5 (09/08/16 0819)  Complications: No apparent anesthesia complications

## 2016-09-08 NOTE — Anesthesia Procedure Notes (Signed)
Procedure Name: Intubation Date/Time: 09/08/2016 10:43 AM Performed by: Arlice ColtMANESS, Lache Dagher B Pre-anesthesia Checklist: Patient identified, Emergency Drugs available, Suction available, Patient being monitored and Timeout performed Patient Re-evaluated:Patient Re-evaluated prior to inductionOxygen Delivery Method: Circle system utilized Preoxygenation: Pre-oxygenation with 100% oxygen Intubation Type: IV induction Ventilation: Mask ventilation without difficulty and Oral airway inserted - appropriate to patient size Laryngoscope Size: Mac and 3 Grade View: Grade I Tube type: Oral Tube size: 7.5 mm Number of attempts: 1 Airway Equipment and Method: Stylet Placement Confirmation: ETT inserted through vocal cords under direct vision,  positive ETCO2 and breath sounds checked- equal and bilateral Secured at: 23 cm Tube secured with: Tape Dental Injury: Teeth and Oropharynx as per pre-operative assessment

## 2016-09-08 NOTE — H&P (Signed)
Orthopaedic Trauma Service H&P/Consult     Chief Complaint: right tibia nonunion s/p open fracture HPI: Roberto Bates is an 40 y.o. male.s/p IM nailing of open fracture on July/20/17 by Dr. Shon BatonBrooks. Persistent pain, inability to bear full weight, and persistent lucency on xray which has showed no progress in healing.  Past Medical History:  Diagnosis Date  . DVT (deep venous thrombosis) (HCC)    right leg   . Medical history non-contributory     Past Surgical History:  Procedure Laterality Date  . APPENDECTOMY    . CHOLECYSTECTOMY    . I&D EXTREMITY Right 05/28/2016   Procedure: IRRIGATION AND DEBRIDEMENT OF OPEN FRACTURE RIGHT TIBIA;  Surgeon: Venita Lickahari Brooks, MD;  Location: [facility] OR;  Service: Orthopedics;  Laterality: Right;  . TIBIA IM NAIL INSERTION Right 05/28/2016   Procedure: INTRAMEDULLARY (IM) NAIL TIBIAL;  Surgeon: Venita Lickahari Brooks, MD;  Location: [facility] OR;  Service: Orthopedics;  Laterality: Right;    Family History  Problem Relation Age of Onset  . Diabetes Mother    Social History:  reports that he has never smoked. He has never used smokeless tobacco. He reports that he drinks alcohol. He reports that he does not use drugs.  Allergies:  Allergies  Allergen Reactions  . No Known Allergies     Medications Prior to Admission  Medication Sig Dispense Refill  . rivaroxaban (XARELTO) 20 MG TABS tablet Take 20 mg by mouth daily.    Marland Kitchen. aspirin 81 MG chewable tablet Chew 4 tablets (324 mg total) by mouth once. (Patient not taking: Reported on 09/04/2016) 30 tablet 2  . methocarbamol (ROBAXIN) 500 MG tablet Take 1 tablet (500 mg total) by mouth 3 (three) times daily as needed for muscle spasms. (Patient not taking: Reported on 09/04/2016) 60 tablet 0  . oxyCODONE-acetaminophen (PERCOCET) 10-325 MG tablet Take 1 tablet by mouth every 4 (four) hours as needed for pain. (Patient not taking: Reported on 09/04/2016) 60 tablet 0    No results found for this or any previous visit (from the  past 48 hour(s)). No results found.  ROS No recent fever, bleeding abnormalities, urologic dysfunction, GI problems, or weight gain. There were no vitals taken for this visit. 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 Exchange nailing with blocking screws 2. PWB afterward 3. Restart Xarelto tomorrow and patient will need more at discharge  Myrene GalasMichael Nikayla Madaris, MD [facility] [phone] (p)  09/08/2016, 8:04 AM

## 2016-09-09 ENCOUNTER — Encounter (HOSPITAL_COMMUNITY): Payer: Self-pay | Admitting: Orthopedic Surgery

## 2016-09-09 DIAGNOSIS — E559 Vitamin D deficiency, unspecified: Secondary | ICD-10-CM | POA: Diagnosis present

## 2016-09-09 DIAGNOSIS — S82291M Other fracture of shaft of right tibia, subsequent encounter for open fracture type I or II with nonunion: Secondary | ICD-10-CM | POA: Diagnosis not present

## 2016-09-09 LAB — VITAMIN D 25 HYDROXY (VIT D DEFICIENCY, FRACTURES): Vit D, 25-Hydroxy: 17.6 ng/mL — ABNORMAL LOW (ref 30.0–100.0)

## 2016-09-09 LAB — CALCITRIOL (1,25 DI-OH VIT D): Vit D, 1,25-Dihydroxy: 26.1 pg/mL (ref 19.9–79.3)

## 2016-09-09 MED ORDER — HYDROCODONE-ACETAMINOPHEN 7.5-325 MG PO TABS: 1.0000 | ORAL_TABLET | Freq: Four times a day (QID) | ORAL | 0 refills | Status: DC | PRN

## 2016-09-09 MED ORDER — CHOLECALCIFEROL 100 MCG (4000 UT) PO TABS: 4000.0000 [IU] | ORAL_TABLET | Freq: Every day | ORAL | 2 refills | Status: DC

## 2016-09-09 MED ORDER — DOCUSATE SODIUM 100 MG PO CAPS: 100.0000 mg | ORAL_CAPSULE | Freq: Two times a day (BID) | ORAL | 0 refills | Status: DC

## 2016-09-09 MED ORDER — VITAMIN D 1000 UNITS PO TABS
4000.0000 [IU] | ORAL_TABLET | Freq: Every day | ORAL | Status: DC
  Administered 2016-09-09: 4000 [IU] via ORAL
  Filled 2016-09-09: qty 4

## 2016-09-09 NOTE — Discharge Instructions (Signed)
[facility] Discharge Instructions,   General Discharge Instructions  WEIGHT BEARING STATUS: Partial weightbearing right leg, 50% body weight  RANGE OF MOTION/ACTIVITY: Unrestricted range of motion right ankle and knee  Wound Care: Daily dressing changes starting on 09/10/2016. See instructions below. Call office with questions. Use Ted stocking instead of Ace wrap for compression  Discharge Wound Care Instructions  Do NOT apply any ointments, solutions or lotions to pin sites or surgical wounds.  These prevent needed drainage and even though solutions like hydrogen peroxide kill bacteria, they also damage cells lining the pin sites that help fight infection.  Applying lotions or ointments can keep the wounds moist and can cause them to breakdown and open up as well. This can increase the risk for infection. When in doubt call the office.  Surgical incisions should be dressed daily.  If any drainage is noted, use one layer of adaptic, then gauze, Kerlix, and an ace wrap.  Once the incision is completely dry and without drainage, it may be left open to air out.  Showering may begin 36-48 hours later.  Cleaning gently with soap and water.  Traumatic wounds should be dressed daily as well.    One layer of adaptic, gauze, Kerlix, then ace wrap.  The adaptic can be discontinued once the draining has ceased    If you have a wet to dry dressing: wet the gauze with saline the squeeze as much saline out so the gauze is moist (not soaking wet), place moistened gauze over wound, then place a dry gauze over the moist one, followed by Kerlix wrap, then ace wrap.  PAIN MEDICATION USE AND EXPECTATIONS  You have likely been given narcotic medications to help control your pain.  After a traumatic event that results in an fracture (broken bone) with or without surgery, it is ok to use narcotic pain medications to help control one's pain.  We understand that everyone responds to pain  differently and each individual patient will be evaluated on a regular basis for the continued need for narcotic medications. Ideally, narcotic medication use should last no more than 6-8 weeks (coinciding with fracture healing).   As a patient it is your responsibility as well to monitor narcotic medication use and report the amount and frequency you use these medications when you come to your office visit.   We would also advise that if you are using narcotic medications, you should take a dose prior to therapy to maximize you participation.  IF YOU ARE ON NARCOTIC MEDICATIONS IT IS NOT PERMISSIBLE TO OPERATE A MOTOR VEHICLE (MOTORCYCLE/CAR/TRUCK/MOPED) OR HEAVY MACHINERY DO NOT MIX NARCOTICS WITH OTHER CNS (CENTRAL NERVOUS SYSTEM) DEPRESSANTS SUCH AS ALCOHOL  Diet: as you were eating previously.  Can use over the counter stool softeners and bowel preparations, such as Miralax, to help with bowel movements.  Narcotics can be constipating.  Be sure to drink plenty of fluids    STOP SMOKING OR USING NICOTINE PRODUCTS!!!!  As discussed nicotine severely impairs your body's ability to heal surgical and traumatic wounds but also impairs bone healing.  Wounds and bone heal by forming microscopic blood vessels (angiogenesis) and nicotine is a vasoconstrictor (essentially, shrinks blood vessels).  Therefore, if vasoconstriction occurs to these microscopic blood vessels they essentially disappear and are unable to deliver necessary nutrients to the healing tissue.  This is one modifiable factor that you can do to dramatically increase your chances of healing your injury.    (This means no smoking, no  nicotine gum, patches, etc)  DO NOT USE NONSTEROIDAL ANTI-INFLAMMATORY DRUGS (NSAID'S)  Using products such as Advil (ibuprofen), Aleve (naproxen), Motrin (ibuprofen) for additional pain control during fracture healing can delay and/or prevent the healing response.  If you would like to take over the counter  (OTC) medication, Tylenol (acetaminophen) is ok.  However, some narcotic medications that are given for pain control contain acetaminophen as well. Therefore, you should not exceed more than 4000 mg of tylenol in a day if you do not have liver disease.  Also note that there are may OTC medicines, such as cold medicines and allergy medicines that my contain tylenol as well.  If you have any questions about medications and/or interactions please ask your doctor/PA or your pharmacist.      ICE AND ELEVATE INJURED/OPERATIVE EXTREMITY  Using ice and elevating the injured extremity above your heart can help with swelling and pain control.  Icing in a pulsatile fashion, such as 20 minutes on and 20 minutes off, can be followed.    Do not place ice directly on skin. Make sure there is a barrier between to skin and the ice pack.    Using frozen items such as frozen peas works well as the conform nicely to the are that needs to be iced.  USE AN ACE WRAP OR TED HOSE FOR SWELLING CONTROL  In addition to icing and elevation, Ace wraps or TED hose are used to help limit and resolve swelling.  It is recommended to use Ace wraps or TED hose until you are informed to stop.    When using Ace Wraps start the wrapping distally (farthest away from the body) and wrap proximally (closer to the body)   Example: If you had surgery on your leg or thing and you do not have a splint on, start the ace wrap at the toes and work your way up to the thigh        If you had surgery on your upper extremity and do not have a splint on, start the ace wrap at your fingers and work your way up to the upper arm  IF YOU ARE IN A SPLINT OR CAST DO NOT REMOVE IT FOR ANY REASON   If your splint gets wet for any reason please contact the office immediately. You may shower in your splint or cast as long as you keep it dry.  This can be done by wrapping in a cast cover or garbage back (or similar)  Do Not stick any thing down your splint or cast  such as pencils, money, or hangers to try and scratch yourself with.  If you feel itchy take benadryl as prescribed on the bottle for itching  IF YOU ARE IN A CAM BOOT (BLACK BOOT)  You may remove boot periodically. Perform daily dressing changes as noted below.  Wash the liner of the boot regularly and wear a sock when wearing the boot. It is recommended that you sleep in the boot until told otherwise  CALL THE OFFICE WITH ANY QUESTIONS OR CONCERNS: [phone]

## 2016-09-09 NOTE — Progress Notes (Signed)
Discharge instructions, RX's, dressing change instructions and follow up appts explained and provided to patient verbalized understanding. Patient left floor via wheelchair accompanied by staff no c/o pain or shortness of breath at d/c.  Enaya Howze, Kae HellerMiranda Lynn, RN

## 2016-09-09 NOTE — Anesthesia Postprocedure Evaluation (Signed)
Anesthesia Post Note  Patient: Roberto Bates  Procedure(s) Performed: Procedure(s) (LRB): INTRAMEDULLARY (IM) NAIL RIGHT TIBIA (Right) HARDWARE REMOVAL RIGHT TIBIA (Right)  Patient location during evaluation: PACU Anesthesia Type: General Level of consciousness: awake and alert Pain management: pain level controlled Vital Signs Assessment: post-procedure vital signs reviewed and stable Respiratory status: spontaneous breathing, nonlabored ventilation, respiratory function stable and patient connected to nasal cannula oxygen Cardiovascular status: blood pressure returned to baseline and stable Postop Assessment: no signs of nausea or vomiting Anesthetic complications: no    Last Vitals:  Vitals:   09/09/16 0623 09/09/16 1357  BP: (!) 115/92 109/65  Pulse: 73 72  Resp: 16 18  Temp: 37.1 C 36.8 C    Last Pain:  Vitals:   09/09/16 1357  TempSrc: Oral  PainSc:                  Neomia Herbel,JAMES TERRILL

## 2016-09-09 NOTE — Evaluation (Signed)
Physical Therapy Evaluation Patient Details Name: Roberto Bates MRN: [id number] DOB: 1976-09-01 Today's Date: 09/09/2016   History of Present Illness  40 y.o.male s/p IM nailing of open fracture on 05/28/16. Now s/p IM nail R tibia, repair of nonunion, and hardware removal of R tibia on 10/31/017. PMHx: DVT R leg.   Clinical Impression  Pt ambulated 250' with crutches and PWB RLE without loss of balance. Instructed pt in RLE strengthening exercises for HEP. From PT standpoint he is ready to DC home.     Follow Up Recommendations No PT follow up    Equipment Recommendations  Crutches    Recommendations for Other Services       Precautions / Restrictions Precautions Precautions: Fall Restrictions Weight Bearing Restrictions: Yes RLE Weight Bearing: Partial weight bearing RLE Partial Weight Bearing Percentage or Pounds: 50%      Mobility  Bed Mobility Overal bed mobility: Needs Assistance Bed Mobility: Supine to Sit     Supine to sit: Min guard     General bed mobility comments: up in recliner  Transfers Overall transfer level: Needs assistance Equipment used: Crutches Transfers: Sit to/from Stand Sit to Stand: Supervision         General transfer comment: good hand placement and technique  Ambulation/Gait Ambulation/Gait assistance: Supervision Ambulation Distance (Feet): 250 Feet Assistive device: Crutches Gait Pattern/deviations: Step-through pattern   Gait velocity interpretation: at or above normal speed for age/gender General Gait Details: good adherence to PWB RLE, VCs for sequencing and to WB thru hands rather than thru axilla, no LOB  Stairs  pt declined stair training, stated he's been doing stairs with crutches at home without problems          Wheelchair Mobility    Modified Rankin (Stroke Patients Only)       Balance Overall balance assessment: Modified Independent                                            Pertinent Vitals/Pain Pain Assessment: 0-10 Pain Score: 6  Pain Location: RLE Pain Descriptors / Indicators: Sore Pain Intervention(s): RN gave pain meds during session    Home Living Family/patient expects to be discharged to:: Private residence Living Arrangements: Spouse/significant other Available Help at Discharge: Family;Available PRN/intermittently Type of Home: House Home Access: Stairs to enter Entrance Stairs-Rails: Can reach both Entrance Stairs-Number of Steps: 4 Home Layout: One level Home Equipment: Walker - 2 wheels;Bedside commode;Shower seat;Wheelchair - manual Additional Comments: has crutches but they are old and falling apart    Prior Function Level of Independence: Independent with assistive device(s)         Comments: Using crutches for mobility PTA. Wife assisting with bathing/dressing as needed.     Hand Dominance   Dominant Hand: Right    Extremity/Trunk Assessment   Upper Extremity Assessment: Overall WFL for tasks assessed           Lower Extremity Assessment: RLE deficits/detail RLE Deficits / Details: knee ext -25* AROM limited by pain, ankle AROM limited 75% by pain, knee ext -3/5    Cervical / Trunk Assessment: Normal  Communication   Communication: No difficulties  Cognition Arousal/Alertness: Awake/alert Behavior During Therapy: WFL for tasks assessed/performed Overall Cognitive Status: Within Functional Limits for tasks assessed  General Comments      Exercises General Exercises - Lower Extremity Quad Sets: AROM;Right;5 reps;Supine Long Arc Quad: AROM;Right;5 reps;Seated Hip Flexion/Marching:  (demonstrated standing hip flexion, ABDuction, extension, knee flexion for HEP)   Assessment/Plan    PT Assessment Patent does not need any further PT services  PT Problem List            PT Treatment Interventions      PT Goals (Current goals can be found in the Care Plan section)   Acute Rehab PT Goals 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 PT Goal Formulation: With patient Time For Goal Achievement: 09/23/16 Potential to Achieve Goals: Good    Frequency     Barriers to discharge        Co-evaluation               End of Session Equipment Utilized During Treatment: Gait belt Activity Tolerance: Patient tolerated treatment well Patient left: in chair;with call bell/phone within reach Nurse Communication: Mobility status    Functional Assessment Tool Used: clinical judgement Functional Limitation: Mobility: Walking and moving around Mobility: Walking and Moving Around Current Status [phone](G8978): At least 1 percent but less than 20 percent impaired, limited or restricted Mobility: Walking and Moving Around Goal Status [phone](G8979): At least 1 percent but less than 20 percent impaired, limited or restricted Mobility: Walking and Moving Around Discharge Status [phone](G8980): At least 1 percent but less than 20 percent impaired, limited or restricted    Time: [id number] PT Time Calculation (min) (ACUTE ONLY): 46 min   Charges:   PT Evaluation $PT Eval Low Complexity: 1 Procedure PT Treatments $Gait Training: 8-22 mins $Therapeutic Exercise: 8-22 mins   PT G Codes:   PT G-Codes **NOT FOR INPATIENT CLASS** Functional Assessment Tool Used: clinical judgement Functional Limitation: Mobility: Walking and moving around Mobility: Walking and Moving Around Current Status ([phone](G8978): At least 1 percent but less than 20 percent impaired, limited or restricted Mobility: Walking and Moving Around Goal Status [phone]([phone]): At least 1 percent but less than 20 percent impaired, limited or restricted Mobility: Walking and Moving Around Discharge Status [phone](G8980): At least 1 percent but less than 20 percent impaired, limited or restricted    Tamala SerUhlenberg, Bryndon Cumbie Kistler 09/09/2016, 10:43 AM [phone]

## 2016-09-09 NOTE — Progress Notes (Signed)
Orthopedic Tech Progress Note Patient Details:  Avis Epleyarl Eaker 1976/08/03 [id number]  Ortho Devices Type of Ortho Device: Crutches Ortho Device/Splint Interventions: Application   Nikki DomCrawford, Analicia Skibinski 09/09/2016, 3:03 PM Viewed order from doctor's order list

## 2016-09-09 NOTE — Progress Notes (Signed)
Orthopaedic Trauma Service Progress Note  Subjective  Doing ok  Would like to go home today Having some R knee pain but tolerable   Review of Systems  Constitutional: Negative for fever.  Respiratory: Negative for shortness of breath and wheezing.   Cardiovascular: Negative for chest pain.  Gastrointestinal: Negative for abdominal pain, nausea and vomiting.  Neurological: Negative for tingling, sensory change and headaches.     Objective   BP (!) 115/92 (BP Location: Right Arm)   Pulse 73   Temp 98.7 F (37.1 C) (Oral)   Resp 16   SpO2 96%   Intake/Output      10/31 0701 - 11/01 0700 11/01 0701 - 11/02 0700   P.O. 200    I.V. 1920    Total Intake 2120     Urine 700    Blood 200    Total Output 900     Net +1220            Labs  Aerobic/Anaerobic Culture (surgical/deep wound)  Order: [id number]  Status:  Preliminary result   Visible to patient:  No (Not Released) Next appt:  None    1d ago   Specimen Description  BONE RIGHT TIBIA   Special Requests  RIGHT TIBIA REAMINGS PATIENT ON FOLLOWING  ANCEF   Gram Stain FEW WBC PRESENT,BOTH PMN AND MONONUCLEAR  NO ORGANISMS SEEN  CRITICAL RESULT CALLED TO, READ BACK BY AND VERIFIED WITH: RN D.JOHNSON [id number]  MLM       Culture PENDING   Report Status PENDING   Resulting Agency [id number]    Specimen Collected: 09/08/16 12:00 Last Resulted: 09/08/16 13:24        Results for Roberto, Bates (MRN [id number]) as of 09/09/2016 11:53  Ref. Range 09/08/2016 08:10  Vitamin D, 25-Hydroxy Latest Ref Range: 30.0 - 100.0 ng/mL 17.6 (L)    Exam  Gen: sitting in bedside chair, NAD Lungs: breathing unlabored Cardiac: RRR, S1 and s2 Ext:       Right Lower Extremity   Dressing c/d/i  Ext warm  DPN, SPN,TN sensation intact  EHL, FHL, AT, PT, peroneals, gastroc motor intact  Swelling stable    Assessment and Plan   POD/HD#: 1  40 y/o male s/p open R tibia fracture admitted for R tibia nonunion   -R tibia  nonunion s/p exchange nailing  PWB (50%) R leg  Unrestricted ROM R ankle and knee  Ice and elevate  Dressing changes starting tomorrow  Will have TED hose provided to pt before dc so he can use a ted hose for dressing change   Ordered new crutches  - Pain management:  Percocet    - DVT R leg   xarelto resumed  F/u US in about 6 weeks   ESR and CRP are normal   - ID:   periop abx completed   - Metabolic Bone Disease:  + vitamin D deficiency   Start supplementation    Vitamin d3 4000 IU daily   - Dispo:  DC home today   Follow up with ortho in 2 weeks     Jari Pigg, PA-C [facility] [phone] (P) [phone] (O) 09/09/2016 11:52 AM

## 2016-09-09 NOTE — Op Note (Signed)
NAMGlennon Bates:  Roberto Bates               ACCOUNT NO.:  [id number]  MEDICAL RECORD NO.:  [id number]  LOCATION:  [id number]                        FACILITY:  [facility]  PHYSICIAN:  Roberto Bates, M.D. DATE OF BIRTH:  [birth year]  DATE OF PROCEDURE:  09/08/2016 DATE OF DISCHARGE:                              OPERATIVE REPORT   PREOPERATIVE DIAGNOSIS:  Right tibia nonunion, second loose tibial nail.  POSTOPERATIVE DIAGNOSIS:  Right tibia nonunion, second loose tibial nail.  PROCEDURES: 1. Intramedullary nailing of the right tibia using a VersaNail 13 mm x     360 mm statically locked. 2. Repair of nonunion, right tibia. 3. Removal of deep implant, right tibia.  SURGEON:  Roberto Bates, M.D.  ASSISTANT:  Roberto MoritaKeith Paul, PA-C.  ANESTHESIA:  General.  COMPLICATIONS:  None.  I/O:  1600 mL crystalloid/EBL 200.  SPECIMENS:  Two from reamings for anaerobic-aerobic culture to microbiology lab.  TOURNIQUET:  None.  DISPOSITION:  To PACU.  CONDITION:  Stable.  BRIEF SUMMARY AND INDICATION FOR PROCEDURE:  Roberto Bates is a 40 year old male involved in a work-related accident during which he sustained an open tibia fracture treated with I and D and IM nailing by Dr. Venita Lickahari Bates.  The patient went on to nonunion with persistent pain, inability to bear full weight, and a feeling of subjective softness, looseness, or instability at the fracture site.  Films clearly showed no progression of healing and this was confirmed with CT.  Fortunately, CT did not identify a large cavitary defect.  I discussed with the patient the risks and benefits of surgical repair as well as the options for repair, which include iliac crest autografting and a formal open takedown of the callus present with iliac crest grafting versus exchange nailing with removal of all of the current hardware reaming and then use of blocking screws to achieve an improved reduction of the nonunion so that the bone edges could be  apposed and this would go on to unite.  After full discussion, the patient wished to proceed with the blocking screws and nail exchange.  We did discuss persistent nonunion, need for further surgery, DVT, PE, loss of motion, and multiple others.  We also specifically discussed DVT as the patient had one previously and this could worsen or develop into a PE.  BRIEF SUMMARY OF PROCEDURE:  The patient was taken to the operating room where operating room where general anesthesia was induced.  We had held his antibiotics, but these were administered prior to incision.  The patient's right lower leg was prepped and draped in usual sterile fashion.  No tourniquet was used.  The old incisions were remade, including extending the anterior to posterior distal locking bolt incision to allow for safe removal with clear identification of nerve, vessel, and tendon.  The anterior knee incision was extended proximally as well.  A medial parapatellar approach to the insertion site was made. The nail engaged proximally and withdrawn.  Guidewire was inserted down to the tibial plafond and confirmed on x-ray.  The 11 mm reamer was then passed obtaining no resistance at all, then the 12 and then the 13 all without any significant resistance at all.  Consequently went up to 13.5 and then 14 and did obtain reaming material out of this, which was sent for anaerobic and aerobic culture.  Cultures were eventually be negative for bacteria on the Gram stain.  The knee was then irrigated through the intramedullary canal with 3000 mL of saline.  We allowed for clear egress to the locking bolt sites distally.  We did not identify any purulence or concern of deep ongoing infection.  New drapes and partial new attire were applied and then the C-arm brought in to identify the correct position for blocking screws to oppose the nonunion site and progress this toward more stability and ultimate healing.  I placed 1  anterior to posterior screw along the lateral side of the proximal fragment within the intramedullary space. I then placed 2 blocking screws posterior to the intended nail path, 1 within the proximal fragment, 1 within the distal.  These were checked on fluoro.  I then went back to the guidewire which was inserted and checked for position and then reamed once more going from 10.5 all the way up to 13.5, and ultimately a 14 around this.  New trajectory created by the screws to produce a nonunion repair.  I then inserted the 13 x 360 mm nail securing 1 medial to lateral distal locking bolt in a fresh path.  The other medial to lateral was partially overlapping with the initial screw placed by Dr. Shon BatonBrooks.  Proximally, similarly, we had only 1 fresh screw that could be placed from oblique lateral to the medial. Prior to placing the proximal locking bolt, we did backslap the nail and were able to identify some slight bowing of the distal lock consistent with improved apposition of the nonunion site and then placed the proximal locking bolt.  All images were re-shot in orthogonal views. Wounds irrigated, closed in standard layered fashion with #1 Vicryl, 2-0 Vicryl and 3-0 nylon.  The patient placed into a sterile gently compressive dressing and taken to PACU in stable condition.  Roberto MoritaKeith Paul, PA-C assisted me throughout.  His assistance was necessary as we held the reduction and for simultaneous closure as well.  PROGNOSIS:  Roberto Bates will progress to weightbearing as tolerated with crutches for assistance.  We will follow up his cultures until definitive and 1-2 day stay in the hospital is anticipated.  He will restart his Xarelto immediately and this should be continued for most likely 3 months and will require surveillance ultrasound 6 months from diagnosis of his DVT.     Roberto Bates, M.D.     MHH/MEDQ  D:  09/08/2016  T:  09/09/2016  Job:  [id number]

## 2016-09-09 NOTE — Discharge Summary (Signed)
Orthopaedic Trauma Service (OTS)  Patient ID: Antion Andres MRN: [id number] DOB/AGE: 13-Jun-1976 40 y.o.  Admit date: 09/08/2016 Discharge date: 09/09/2016  Admission Diagnoses: Right tibia and fibula nonunion Status post open fracture right tibia   Discharge Diagnoses:  Principal Problem:   Tibia and fibula open fracture, right, type I or II, with nonunion, subsequent encounter Active Problems:   Vitamin D deficiency   Procedures Performed: 09/08/2016- handy Intramedullary nailing right tibia Repair nonunion right tibia Removal of hardware right tibia  Discharged Condition: good  Hospital Course:   40 year old male admitted for treatment of right tibia nonunion. Patient sustained an open tibia fracture in July when he was struck by a forklift. Patient was treated with intramedullary nailing however he unfortunately developed a nonunion. Patient was taken to the OR on 09/08/2016 for the procedure noted above. Patient tolerated the procedure well. No issues were noted. He was admitted overnight for observation and pain control as well as work with therapies. Patient worked with therapies very well on postoperative day #1 and was deemed to be stable. He was voiding and tolerating a diet at the time of discharge. Discharge stable condition. No evidence of acute infection or osteomyelitis. Patient will not be discharged on antibiotics. Again patient did develop a DVT 4 weeks after his original surgery and his xarelto was restarted on postoperative day #1. He will remain on this until further notice. We anticipate surveillance ultrasound in about 6 weeks' time   Preoperative labs were notable for normal CRP and ESR. He was noted to be vitamin D deficient. The time of discharge his calcitriol levels are pending  Consults: None  Significant Diagnostic Studies: labs:  Aerobic/Anaerobic Culture (surgical/deep wound)  Order: [id number]  Status:  Preliminary result   Visible to patient:   No (Not Released) Next appt:  None    1d ago   Specimen Description  BONE RIGHT TIBIA   Special Requests  RIGHT TIBIA REAMINGS PATIENT ON FOLLOWING  [facility]   Gram Stain FEW WBC PRESENT,BOTH PMN AND MONONUCLEAR  NO ORGANISMS SEEN  CRITICAL RESULT CALLED TO, READ BACK BY AND VERIFIED WITH: RN D.JOHNSON [id number] @1327  MLM       Culture PENDING   Report Status PENDING   Resulting Agency [facility]    Specimen Collected: 09/08/16 12:00 Last Resulted: 09/08/16 13:24        Results for JOSUE, FALCONI (MRN [id number]) as of 09/09/2016 11:53  Ref. Range 09/08/2016 08:10  Vitamin D, 25-Hydroxy Latest Ref Range: 30.0 - 100.0 ng/mL 17.6 (L)      Treatments: IV hydration, antibiotics: Ancef, analgesia: Dilaudid and Norco, anticoagulation: Xarelto, therapies: PT and RN and surgery: As above  Discharge Exam:       Orthopaedic Trauma Service Progress Note   Subjective   Doing ok  Would like to go home today Having some R knee pain but tolerable    Review of Systems  Constitutional: Negative for fever.  Respiratory: Negative for shortness of breath and wheezing.   Cardiovascular: Negative for chest pain.  Gastrointestinal: Negative for abdominal pain, nausea and vomiting.  Neurological: Negative for tingling, sensory change and headaches.        Objective    BP (!) 115/92 (BP Location: Right Arm)   Pulse 73   Temp 98.7 F (37.1 C) (Oral)   Resp 16   SpO2 96%    Intake/Output      10/31 0701 - 11/01 0700 11/01 0701 - 11/02 0700   P.O. 200  I.V. 1920    Total Intake 2120     Urine 700    Blood 200    Total Output 900     Net +1220             Labs   Aerobic/Anaerobic Culture (surgical/deep wound)  Order: [id number]  Status:  Preliminary result   Visible to patient:  No (Not Released) Next appt:  None        1d ago   Specimen Description  BONE RIGHT TIBIA   Special Requests  RIGHT TIBIA REAMINGS PATIENT ON FOLLOWING  [facility]   Gram Stain FEW WBC PRESENT,BOTH PMN AND  MONONUCLEAR  NO ORGANISMS SEEN  CRITICAL RESULT CALLED TO, READ BACK BY AND VERIFIED WITH: RN D.JOHNSON [id number] @1327  MLM         Culture PENDING   Report Status PENDING   Resulting Agency [email or web address]      Specimen Collected: 09/08/16 12:00 Last Resulted: 09/08/16 13:24             Results for DECKER, COGDELL (MRN [id number]) as of 09/09/2016 11:53  Ref. Range 09/08/2016 08:10  Vitamin D, 25-Hydroxy Latest Ref Range: 30.0 - 100.0 ng/mL 17.6 (L)   Exam   Gen: sitting in bedside chair, NAD Lungs: breathing unlabored Cardiac: RRR, S1 and s2 Ext:       Right Lower Extremity              Dressing c/d/i             Ext warm             DPN, SPN,TN sensation intact             EHL, FHL, AT, PT, peroneals, gastroc motor intact             Swelling stable      Assessment and Plan    POD/HD#: 1   40 y/o male s/p open R tibia fracture admitted for R tibia nonunion    -R tibia nonunion s/p exchange nailing             PWB (50%) R leg             Unrestricted ROM R ankle and knee             Ice and elevate             Dressing changes starting tomorrow             Will have TED hose provided to pt before dc so he can use a ted hose for dressing change               Ordered new crutches   - Pain management:             Percocet      - DVT R leg               xarelto resumed             F/u US in about 6 weeks              ESR and CRP are normal    - ID:              periop abx completed    - Metabolic Bone Disease:             + vitamin D deficiency              Start  supplementation                          Vitamin d3 4000 IU daily    - Dispo:             DC home today              Follow up with ortho in 2 weeks     Disposition: 06-Home-Health Care Svc  Discharge Instructions    Call MD / Call 911    Complete by:  As directed    If you experience chest pain or shortness of breath, CALL 911 and be transported to the hospital emergency room.  If you develope a  fever above 101 F, pus (white drainage) or increased drainage or redness at the wound, or calf pain, call your surgeon's office.   Constipation Prevention    Complete by:  As directed    Drink plenty of fluids.  Prune juice may be helpful.  You may use a stool softener, such as Colace (over the counter) 100 mg twice a day.  Use MiraLax (over the counter) for constipation as needed.   Diet general    Complete by:  As directed    Discharge instructions    Complete by:  As directed    Orthopaedic Trauma Service Discharge Instructions,   General Discharge Instructions  WEIGHT BEARING STATUS: Partial weightbearing right leg, 50% body weight  RANGE OF MOTION/ACTIVITY: Unrestricted range of motion right ankle and knee  Wound Care: Daily dressing changes starting on 09/10/2016. See instructions below. Call office with questions. Use Ted stocking instead of Ace wrap for compression  Discharge Wound Care Instructions  Do NOT apply any ointments, solutions or lotions to pin sites or surgical wounds.  These prevent needed drainage and even though solutions like hydrogen peroxide kill bacteria, they also damage cells lining the pin sites that help fight infection.  Applying lotions or ointments can keep the wounds moist and can cause them to breakdown and open up as well. This can increase the risk for infection. When in doubt call the office.  Surgical incisions should be dressed daily.  If any drainage is noted, use one layer of adaptic, then gauze, Kerlix, and an ace wrap.  Once the incision is completely dry and without drainage, it may be left open to air out.  Showering may begin 36-48 hours later.  Cleaning gently with soap and water.  Traumatic wounds should be dressed daily as well.    One layer of adaptic, gauze, Kerlix, then ace wrap.  The adaptic can be discontinued once the draining has ceased    If you have a wet to dry dressing: wet the gauze with saline the squeeze as much saline  out so the gauze is moist (not soaking wet), place moistened gauze over wound, then place a dry gauze over the moist one, followed by Kerlix wrap, then ace wrap.  PAIN MEDICATION USE AND EXPECTATIONS  You have likely been given narcotic medications to help control your pain.  After a traumatic event that results in an fracture (broken bone) with or without surgery, it is ok to use narcotic pain medications to help control one's pain.  We understand that everyone responds to pain differently and each individual patient will be evaluated on a regular basis for the continued need for narcotic medications. Ideally, narcotic medication use should last no more than 6-8 weeks (coinciding with fracture healing).  As a patient it is your responsibility as well to monitor narcotic medication use and report the amount and frequency you use these medications when you come to your office visit.   We would also advise that if you are using narcotic medications, you should take a dose prior to therapy to maximize you participation.  IF YOU ARE ON NARCOTIC MEDICATIONS IT IS NOT PERMISSIBLE TO OPERATE A MOTOR VEHICLE (MOTORCYCLE/CAR/TRUCK/MOPED) OR HEAVY MACHINERY DO NOT MIX NARCOTICS WITH OTHER CNS (CENTRAL NERVOUS SYSTEM) DEPRESSANTS SUCH AS ALCOHOL  Diet: as you were eating previously.  Can use over the counter stool softeners and bowel preparations, such as Miralax, to help with bowel movements.  Narcotics can be constipating.  Be sure to drink plenty of fluids    STOP SMOKING OR USING NICOTINE PRODUCTS!!!!  As discussed nicotine severely impairs your body's ability to heal surgical and traumatic wounds but also impairs bone healing.  Wounds and bone heal by forming microscopic blood vessels (angiogenesis) and nicotine is a vasoconstrictor (essentially, shrinks blood vessels).  Therefore, if vasoconstriction occurs to these microscopic blood vessels they essentially disappear and are unable to deliver necessary  nutrients to the healing tissue.  This is one modifiable factor that you can do to dramatically increase your chances of healing your injury.    (This means no smoking, no nicotine gum, patches, etc)  DO NOT USE NONSTEROIDAL ANTI-INFLAMMATORY DRUGS (NSAID'S)  Using products such as Advil (ibuprofen), Aleve (naproxen), Motrin (ibuprofen) for additional pain control during fracture healing can delay and/or prevent the healing response.  If you would like to take over the counter (OTC) medication, Tylenol (acetaminophen) is ok.  However, some narcotic medications that are given for pain control contain acetaminophen as well. Therefore, you should not exceed more than 4000 mg of tylenol in a day if you do not have liver disease.  Also note that there are may OTC medicines, such as cold medicines and allergy medicines that my contain tylenol as well.  If you have any questions about medications and/or interactions please ask your doctor/PA or your pharmacist.      ICE AND ELEVATE INJURED/OPERATIVE EXTREMITY  Using ice and elevating the injured extremity above your heart can help with swelling and pain control.  Icing in a pulsatile fashion, such as 20 minutes on and 20 minutes off, can be followed.    Do not place ice directly on skin. Make sure there is a barrier between to skin and the ice pack.    Using frozen items such as frozen peas works well as the conform nicely to the are that needs to be iced.  USE AN ACE WRAP OR TED HOSE FOR SWELLING CONTROL  In addition to icing and elevation, Ace wraps or TED hose are used to help limit and resolve swelling.  It is recommended to use Ace wraps or TED hose until you are informed to stop.    When using Ace Wraps start the wrapping distally (farthest away from the body) and wrap proximally (closer to the body)   Example: If you had surgery on your leg or thing and you do not have a splint on, start the ace wrap at the toes and work your way up to the thigh         If you had surgery on your upper extremity and do not have a splint on, start the ace wrap at your fingers and work your way up to the upper arm  IF YOU ARE  IN A SPLINT OR CAST DO NOT REMOVE IT FOR ANY REASON   If your splint gets wet for any reason please contact the office immediately. You may shower in your splint or cast as long as you keep it dry.  This can be done by wrapping in a cast cover or garbage back (or similar)  Do Not stick any thing down your splint or cast such as pencils, money, or hangers to try and scratch yourself with.  If you feel itchy take benadryl as prescribed on the bottle for itching  IF YOU ARE IN A CAM BOOT (BLACK BOOT)  You may remove boot periodically. Perform daily dressing changes as noted below.  Wash the liner of the boot regularly and wear a sock when wearing the boot. It is recommended that you sleep in the boot until told otherwise  CALL THE OFFICE WITH ANY QUESTIONS OR CONCERNS: [phone]   Driving restrictions    Complete by:  As directed    No driving   Increase activity slowly as tolerated    Complete by:  As directed        Medication List    STOP taking these medications   aspirin 81 MG chewable tablet   methocarbamol 500 MG tablet Commonly known as:  ROBAXIN   oxyCODONE-acetaminophen 10-325 MG tablet Commonly known as:  PERCOCET     TAKE these medications   Cholecalciferol 4000 units Tabs Take 4,000 Units by mouth daily.   docusate sodium 100 MG capsule Commonly known as:  COLACE Take 1 capsule (100 mg total) by mouth 2 (two) times daily.   HYDROcodone-acetaminophen 7.5-325 MG tablet Commonly known as:  NORCO Take 1-2 tablets by mouth every 6 (six) hours as needed for moderate pain or severe pain.   rivaroxaban 20 MG Tabs tablet Commonly known as:  XARELTO Take 20 mg by mouth daily.        Discharge Instructions and Plan:  40 y/o male s/p open R tibia fracture admitted for R tibia nonunion    -R tibia nonunion  s/p exchange nailing             PWB (50%) R leg             Unrestricted ROM R ankle and knee             Ice and elevate             Dressing changes starting tomorrow             Will have TED hose provided to pt before dc so he can use a ted hose for dressing change               Ordered new crutches   - Pain management:             Percocet      - DVT R leg               xarelto resumed             F/u US in about 6 weeks              ESR and CRP are normal    - ID:              periop abx completed    - Metabolic Bone Disease:             + vitamin D deficiency  Start supplementation                          Vitamin d3 4000 IU daily    - Dispo:             DC home today              Follow up with ortho in 2 weeks     Signed:  Jari Pigg, PA-C [facility] [phone] (P) 09/09/2016, 12:05 PM

## 2016-09-09 NOTE — Evaluation (Signed)
Occupational Therapy Evaluation and Discharge Patient Details Name: Roberto Bates MRN: [id number] DOB: [birth year] Today's Date: 09/09/2016    History of Present Illness 40 y.o.male s/p IM nailing of open fracture on 05/28/16. Now s/p IM nail R tibia, repair of nonunion, and hardware removal of R tibia on 10/31/017. PMHx: DVT R leg.    Clinical Impression   Pt reports he has been managing ADL pretty much on his own PTA, occasional assist from wife as needed. Currently pt requires min guard assist for functional mobility and ADL with the exception of min assist for LB ADL. Educated pt on PWB (50%) status of RLE, elevation for edema control, and compensatory strategies for ADL. Pt able to maintain PWB on RLE throughout functional activities this session. Pt planning to d/c home with intermittent supervision from his wife. No further acute OT needs identified; signing off at this time. Please re-consult if needs change. Thank you for this referral.    Follow Up Recommendations  No OT follow up;Supervision - Intermittent    Equipment Recommendations  None recommended by OT    Recommendations for Other Services PT consult     Precautions / Restrictions Precautions Precautions: Fall Restrictions Weight Bearing Restrictions: Yes RLE Weight Bearing: Partial weight bearing RLE Partial Weight Bearing Percentage or Pounds: 50%      Mobility Bed Mobility Overal bed mobility: Needs Assistance Bed Mobility: Supine to Sit     Supine to sit: Min guard     General bed mobility comments: Min guard for RLE from EOB to floor.   Transfers Overall transfer level: Needs assistance Equipment used: Rolling walker (2 wheeled) Transfers: Sit to/from Stand Sit to Stand: Min guard         General transfer comment: Min guard for safety with sit to stand from EOB. Pt with good hand placement and technique. Able to maintain PWB status on RLE.    Balance Overall balance assessment: No apparent  balance deficits (not formally assessed)                                          ADL Overall ADL's : Needs assistance/impaired Eating/Feeding: Independent;Sitting   Grooming: Supervision/safety;Standing   Upper Body Bathing: Set up;Sitting   Lower Body Bathing: Minimal assistance;Sit to/from stand   Upper Body Dressing : Set up;Sitting   Lower Body Dressing: Minimal assistance;Sit to/from stand   Toilet Transfer: Min guard;Ambulation;BSC;RW Toilet Transfer Details (indicate cue type and reason): Simulated by sit to stand from EOB with functional mobility in room.         Functional mobility during ADLs: Min guard;Rolling walker General ADL Comments: Reviewed ADL techniques with pt, educated on elevation for edema.     Vision     Perception     Praxis      Pertinent Vitals/Pain Pain Assessment: 0-10 Pain Score: 10-Worst pain ever Pain Location: RLE Pain Descriptors / Indicators: Aching;Grimacing;Guarding Pain Intervention(s): Monitored during session;Repositioned     Hand Dominance Right   Extremity/Trunk Assessment Upper Extremity Assessment Upper Extremity Assessment: Overall WFL for tasks assessed   Lower Extremity Assessment Lower Extremity Assessment: Defer to PT evaluation   Cervical / Trunk Assessment Cervical / Trunk Assessment: Normal   Communication Communication Communication: No difficulties   Cognition Arousal/Alertness: Awake/alert Behavior During Therapy: WFL for tasks assessed/performed Overall Cognitive Status: Within Functional Limits for tasks assessed  General Comments       Exercises       Shoulder Instructions      Home Living Family/patient expects to be discharged to:: Private residence Living Arrangements: Spouse/significant other Available Help at Discharge: Family;Available PRN/intermittently Type of Home: House Home Access: Stairs to enter Entergy CorporationEntrance Stairs-Number of  Steps: 4 Entrance Stairs-Rails: Can reach both Home Layout: One level     Bathroom Shower/Tub: Producer, television/film/videoWalk-in shower   Bathroom Toilet: Standard     Home Equipment: Environmental consultantWalker - 2 wheels;Bedside commode;Shower seat;Wheelchair - manual          Prior Functioning/Environment Level of Independence: Independent with assistive device(s)        Comments: Using crutches for mobility PTA. Wife assisting with bathing/dressing as needed.        OT Problem List:     OT Treatment/Interventions:      OT Goals(Current goals can be found in the care plan section) Acute Rehab OT Goals Patient Stated Goal: no more surgeries OT Goal Formulation: All assessment and education complete, DC therapy  OT Frequency:     Barriers to D/C:            Co-evaluation              End of Session Equipment Utilized During Treatment: Gait belt;Rolling walker Nurse Communication: Mobility status  Activity Tolerance: Patient tolerated treatment well Patient left: in chair;with call bell/phone within reach   Time: 0841-0859 OT Time Calculation (min): 18 min Charges:  OT General Charges $OT Visit: 1 Procedure OT Evaluation $OT Eval Low Complexity: 1 Procedure G-Codes: OT G-codes **NOT FOR INPATIENT CLASS** Functional Assessment Tool Used: Clinical judgement Functional Limitation: Self care Self Care Current Status (N8295(G8987): At least 1 percent but less than 20 percent impaired, limited or restricted Self Care Goal Status (A2130(G8988): At least 1 percent but less than 20 percent impaired, limited or restricted Self Care Discharge Status [id number](G8989): At least 1 percent but less than 20 percent impaired, limited or restricted   Gaye AlkenBailey A Eupha Lobb M.S., OTR/L Pager: [phone]  09/09/2016, 9:09 AM

## 2016-09-13 LAB — AEROBIC/ANAEROBIC CULTURE (SURGICAL/DEEP WOUND)

## 2016-09-13 LAB — AEROBIC/ANAEROBIC CULTURE W GRAM STAIN (SURGICAL/DEEP WOUND): Culture: NO GROWTH

## 2018-03-26 ENCOUNTER — Encounter (HOSPITAL_COMMUNITY): Payer: Self-pay

## 2018-03-26 ENCOUNTER — Emergency Department (HOSPITAL_COMMUNITY)
Admission: EM | Admit: 2018-03-26 | Discharge: 2018-03-26 | Disposition: A | Discharge status: Home / Self Care | Payer: Self-pay | Attending: Emergency Medicine | Admitting: Emergency Medicine

## 2018-03-26 ENCOUNTER — Other Ambulatory Visit: Payer: Self-pay

## 2018-03-26 DIAGNOSIS — Z7901 Long term (current) use of anticoagulants: Secondary | ICD-10-CM | POA: Insufficient documentation

## 2018-03-26 DIAGNOSIS — Z79899 Other long term (current) drug therapy: Secondary | ICD-10-CM | POA: Insufficient documentation

## 2018-03-26 DIAGNOSIS — R5381 Other malaise: Secondary | ICD-10-CM | POA: Insufficient documentation

## 2018-03-26 DIAGNOSIS — R531 Weakness: Secondary | ICD-10-CM | POA: Insufficient documentation

## 2018-03-26 LAB — URINALYSIS, ROUTINE W REFLEX MICROSCOPIC
Bilirubin Urine: NEGATIVE
GLUCOSE, UA: NEGATIVE mg/dL
HGB URINE DIPSTICK: NEGATIVE
KETONES UR: NEGATIVE mg/dL
Leukocytes, UA: NEGATIVE
Nitrite: NEGATIVE
PROTEIN: NEGATIVE mg/dL
Specific Gravity, Urine: 1.025 (ref 1.005–1.030)
pH: 5 (ref 5.0–8.0)

## 2018-03-26 LAB — I-STAT TROPONIN, ED: Troponin i, poc: 0 ng/mL (ref 0.00–0.08)

## 2018-03-26 LAB — CBC
HCT: 49.3 % (ref 39.0–52.0)
Hemoglobin: 16.7 g/dL (ref 13.0–17.0)
MCH: 27.6 pg (ref 26.0–34.0)
MCHC: 33.9 g/dL (ref 30.0–36.0)
MCV: 81.4 fL (ref 78.0–100.0)
PLATELETS: 197 10*3/uL (ref 150–400)
RBC: 6.06 MIL/uL — ABNORMAL HIGH (ref 4.22–5.81)
RDW: 12.8 % (ref 11.5–15.5)
WBC: 7 10*3/uL (ref 4.0–10.5)

## 2018-03-26 LAB — BASIC METABOLIC PANEL WITH GFR
Anion gap: 9 (ref 5–15)
BUN: 17 mg/dL (ref 6–20)
CO2: 24 mmol/L (ref 22–32)
Calcium: 8.8 mg/dL — ABNORMAL LOW (ref 8.9–10.3)
Chloride: 103 mmol/L (ref 101–111)
Creatinine, Ser: 1.27 mg/dL — ABNORMAL HIGH (ref 0.61–1.24)
GFR calc Af Amer: >60 mL/min
GFR calc non Af Amer: >60 mL/min
Glucose, Bld: 86 mg/dL (ref 65–99)
Potassium: 3.9 mmol/L (ref 3.5–5.1)
Sodium: 136 mmol/L (ref 135–145)

## 2018-03-26 LAB — CBG MONITORING, ED: Glucose-Capillary: 99 mg/dL (ref 65–99)

## 2018-03-26 NOTE — ED Provider Notes (Signed)
[facility]-EMERGENCY DEPT Provider Note   CSN: [id number] Arrival date & time: 03/26/18  1245     History   Chief Complaint Chief Complaint  Patient presents with  . Near Syncope    HPI Roberto Bates is a 42 y.o. male.  Patient c/o general malaise for the past 2-3 days. Started at work, felt general malaise, gen weakness, felt clammy. Mild diarrhea/nausea. Symptoms improved yesterday and not completely resolved today. Symptoms constant, persistent. No specific exacerbating or alleviating factors. No relation to exertion. No chest pain or discomfort. No sob or unusual doe. No current medication use of any sort. No leg pain or swelling. Denies headache. No neck or back pain. No palpitations. No abd pain. No dysuria or gu c/o. No room spinning or vertigo. No balance problems or unsteadiness of gait. No recent blood loss/rectal bleeding. No known fevers.   The history is provided by the patient.  Near Syncope  Pertinent negatives include no chest pain, no abdominal pain, no headaches and no shortness of breath.    Past Medical History:  Diagnosis Date  . DVT (deep venous thrombosis) (HCC)    right leg     Patient Active Problem List   Diagnosis Date Noted  . Vitamin D deficiency 09/09/2016  . Tibia and fibula open fracture, right, type I or II, with nonunion, subsequent encounter 09/08/2016  . S/p tibial fracture   . AKI (acute kidney injury) (HCC)   . Leukocytosis   . Tibia fracture 05/28/2016  . Hypoglycemia 08/10/2013    Past Surgical History:  Procedure Laterality Date  . APPENDECTOMY    . CHOLECYSTECTOMY    . HARDWARE REMOVAL Right 09/08/2016   Procedure: HARDWARE REMOVAL RIGHT TIBIA;  Surgeon: Myrene Galas, MD;  Location: [facility] OR;  Service: Orthopedics;  Laterality: Right;  . I&D EXTREMITY Right 05/28/2016   Procedure: IRRIGATION AND DEBRIDEMENT OF OPEN FRACTURE RIGHT TIBIA;  Surgeon: Venita Lick, MD;  Location: [facility] OR;  Service: Orthopedics;   Laterality: Right;  . TIBIA IM NAIL INSERTION Right 05/28/2016   Procedure: INTRAMEDULLARY (IM) NAIL TIBIAL;  Surgeon: Venita Lick, MD;  Location: [facility] OR;  Service: Orthopedics;  Laterality: Right;  . TIBIA IM NAIL INSERTION Right 09/08/2016   Procedure: INTRAMEDULLARY (IM) NAIL RIGHT TIBIA;  Surgeon: Myrene Galas, MD;  Location: [facility] OR;  Service: Orthopedics;  Laterality: Right;        Home Medications    Prior to Admission medications   Medication Sig Start Date End Date Taking? Authorizing Provider  cholecalciferol 4000 units TABS Take 4,000 Units by mouth daily. 09/09/16   Montez Morita, PA-C  docusate sodium (COLACE) 100 MG capsule Take 1 capsule (100 mg total) by mouth 2 (two) times daily. 09/09/16   Montez Morita, PA-C  HYDROcodone-acetaminophen (NORCO) 7.5-325 MG tablet Take 1-2 tablets by mouth every 6 (six) hours as needed for moderate pain or severe pain. 09/09/16   Montez Morita, PA-C  rivaroxaban (XARELTO) 20 MG TABS tablet Take 20 mg by mouth daily.    [provider]    Family History Family History  Problem Relation Age of Onset  . Diabetes Mother     Social History Social History   Tobacco Use  . Smoking status: Never Smoker  . Smokeless tobacco: Never Used  Substance Use Topics  . Alcohol use: Yes    Comment: occasionally   . Drug use: No     Allergies   No known allergies   Review of Systems Review of  Systems  Constitutional: Negative for chills and fever.  HENT: Negative for sore throat.   Eyes: Negative for visual disturbance.  Respiratory: Negative for cough and shortness of breath.   Cardiovascular: Positive for near-syncope. Negative for chest pain, palpitations and leg swelling.  Gastrointestinal: Negative for abdominal pain and blood in stool.  Genitourinary: Negative for dysuria and flank pain.  Musculoskeletal: Negative for back pain and neck pain.  Skin: Negative for rash.  Neurological: Negative for dizziness, syncope, speech  difficulty, weakness, numbness and headaches.  Hematological: Does not bruise/bleed easily.  Psychiatric/Behavioral: Negative for confusion.     Physical Exam Updated Vital Signs BP 140/81 (BP Location: Left Arm)   Pulse 66   Temp 98.1 F (36.7 C) (Oral)   Resp 18   SpO2 99%   Physical Exam  Constitutional: He is oriented to person, place, and time. He appears well-developed and well-nourished. No distress.  HENT:  Head: Atraumatic.  Mouth/Throat: Oropharynx is clear and moist.  Eyes: Pupils are equal, round, and reactive to light. Conjunctivae and EOM are normal.  Neck: Neck supple. No tracheal deviation present. No thyromegaly present.  No bruits.   Cardiovascular: Normal rate, regular rhythm, normal heart sounds and intact distal pulses. Exam reveals no gallop and no friction rub.  No murmur heard. Pulmonary/Chest: Effort normal and breath sounds normal. No accessory muscle usage. No respiratory distress.  Abdominal: Soft. Bowel sounds are normal. He exhibits no distension. There is no tenderness.  Genitourinary:  Genitourinary Comments: No cva tenderness  Musculoskeletal: He exhibits no edema.  Neurological: He is alert and oriented to person, place, and time. No cranial nerve deficit.  Speech normal. Motor intact bil, stre 5/5. sens grossly intact. Steady gait.   Skin: Skin is warm and dry. No rash noted. He is not diaphoretic.  Psychiatric: He has a normal mood and affect.  Nursing note and vitals reviewed.    ED Treatments / Results  Labs (all labs ordered are listed, but only abnormal results are displayed) Results for orders placed or performed during the hospital encounter of 03/26/18  Basic metabolic panel  Result Value Ref Range   Sodium 136 135 - 145 mmol/L   Potassium 3.9 3.5 - 5.1 mmol/L   Chloride 103 101 - 111 mmol/L   CO2 24 22 - 32 mmol/L   Glucose, Bld 86 65 - 99 mg/dL   BUN 17 6 - 20 mg/dL   Creatinine, Ser 8.41 (H) 0.61 - 1.24 mg/dL   Calcium  8.8 (L) 8.9 - 10.3 mg/dL   GFR calc non Af Amer >60 >60 mL/min   GFR calc Af Amer >60 >60 mL/min   Anion gap 9 5 - 15  CBC  Result Value Ref Range   WBC 7.0 4.0 - 10.5 K/uL   RBC 6.06 (H) 4.22 - 5.81 MIL/uL   Hemoglobin 16.7 13.0 - 17.0 g/dL   HCT 32.4 40.1 - 02.7 %   MCV 81.4 78.0 - 100.0 fL   MCH 27.6 26.0 - 34.0 pg   MCHC 33.9 30.0 - 36.0 g/dL   RDW 25.3 66.4 - 40.3 %   Platelets 197 150 - 400 K/uL  Urinalysis, Routine w reflex microscopic  Result Value Ref Range   Color, Urine YELLOW YELLOW   APPearance CLEAR CLEAR   Specific Gravity, Urine 1.025 1.005 - 1.030   pH 5.0 5.0 - 8.0   Glucose, UA NEGATIVE NEGATIVE mg/dL   Hgb urine dipstick NEGATIVE NEGATIVE   Bilirubin Urine NEGATIVE NEGATIVE  Ketones, ur NEGATIVE NEGATIVE mg/dL   Protein, ur NEGATIVE NEGATIVE mg/dL   Nitrite NEGATIVE NEGATIVE   Leukocytes, UA NEGATIVE NEGATIVE  CBG monitoring, ED  Result Value Ref Range   Glucose-Capillary 99 65 - 99 mg/dL  I-stat troponin, ED  Result Value Ref Range   Troponin i, poc 0.00 0.00 - 0.08 ng/mL   Comment 3           EKG EKG Interpretation  Date/Time:  Saturday Mar 26 2018 13:20:59 EDT Ventricular Rate:  77 PR Interval:    QRS Duration: 89 QT Interval:  345 QTC Calculation: 391 R Axis:   54 Text Interpretation:  Sinus rhythm Nonspecific T wave abnormality Confirmed by Cathren Laine ([id number]) on 03/26/2018 4:08:11 PM   Radiology No results found.  Procedures Procedures (including critical care time)  Medications Ordered in ED Medications - No data to display   Initial Impression / Assessment and Plan / ED Course  I have reviewed the triage vital signs and the nursing notes.  Pertinent labs & imaging results that were available during my care of the patient were reviewed by me and considered in my medical decision making (see chart for details).  Ecg. Labs.  Reviewed nursing notes and prior charts for additional history.   Po fluids.  Labs reviewed -  after symptoms for 2-3 days, trop 0. Symptoms/eval do not appear c/w ACS.  Patient currently feels improved, and appears stable for d/c. ?possible viral illness.   rec outpt pcp f/u.   Return precautions provided.   Final Clinical Impressions(s) / ED Diagnoses   Final diagnoses:  None    ED Discharge Orders    None       Cathren Laine, MD 03/26/18 1743

## 2018-03-26 NOTE — Discharge Instructions (Addendum)
It was our pleasure to provide your ER care today - we hope that you feel better.  Your lab tests look good/normal.  Rest. Drink plenty of fluids.  Follow up with primary care doctor in the next 2-3 days for recheck if symptoms fail to improve/resolve.  Return to ER if worse, new symptoms, fevers, new or severe pain, chest pain, trouble breathing, other concern.

## 2018-03-26 NOTE — ED Notes (Signed)
Sandwich and juice given to pt.

## 2018-03-26 NOTE — ED Triage Notes (Signed)
He c/o persistent "dizziness; shakiness and weakness" since last Thurs. He is in no distress. He states he has had similar s/s, "but this time it's lasting longer". He is in no distress.

## 2018-12-09 ENCOUNTER — Encounter (HOSPITAL_COMMUNITY): Payer: Self-pay | Admitting: Emergency Medicine

## 2018-12-09 ENCOUNTER — Other Ambulatory Visit: Payer: Self-pay

## 2018-12-09 ENCOUNTER — Emergency Department (HOSPITAL_COMMUNITY)
Admission: EM | Admit: 2018-12-09 | Discharge: 2018-12-09 | Disposition: A | Discharge status: Home / Self Care | Payer: Self-pay | Attending: Emergency Medicine | Admitting: Emergency Medicine

## 2018-12-09 ENCOUNTER — Emergency Department (HOSPITAL_COMMUNITY): Payer: Self-pay

## 2018-12-09 DIAGNOSIS — J069 Acute upper respiratory infection, unspecified: Secondary | ICD-10-CM | POA: Insufficient documentation

## 2018-12-09 DIAGNOSIS — Z79899 Other long term (current) drug therapy: Secondary | ICD-10-CM | POA: Insufficient documentation

## 2018-12-09 LAB — GROUP A STREP BY PCR: Group A Strep by PCR: NOT DETECTED

## 2018-12-09 MED ORDER — AZITHROMYCIN 250 MG PO TABS: 250.0000 mg | ORAL_TABLET | Freq: Every day | ORAL | 0 refills | Status: DC

## 2018-12-09 MED ORDER — DEXAMETHASONE 4 MG PO TABS
10.0000 mg | ORAL_TABLET | Freq: Once | ORAL | Status: AC
  Administered 2018-12-09: 10 mg via ORAL
  Filled 2018-12-09: qty 3

## 2018-12-09 MED ORDER — IBUPROFEN 800 MG PO TABS
800.0000 mg | ORAL_TABLET | Freq: Once | ORAL | Status: AC
  Administered 2018-12-09: 800 mg via ORAL
  Filled 2018-12-09: qty 1

## 2018-12-09 NOTE — ED Notes (Signed)
Patient verbalizes understanding of discharge instructions. Opportunity for questioning and answers were provided. Armband removed by staff, pt discharged from ED.  

## 2018-12-09 NOTE — ED Provider Notes (Signed)
[facility] EMERGENCY [facility]PART[facility] Provider Note   CSN: [id number] Arrival date & time: 12/09/18  [id number]     History   Chief Complaint Chief Complaint  Patient presents with  . URI    HPI Roberto Naegeliarl Agar Jr. is a 43 y.o. male.  The history is provided by the patient.  URI  Presenting symptoms: congestion, cough, fever and sore throat   Presenting symptoms: no ear pain and no rhinorrhea   Severity:  Mild Onset quality:  Gradual Duration:  2 days Timing:  Constant Progression:  Unchanged Chronicity:  New Relieved by:  OTC medications Worsened by:  Nothing Associated symptoms: headaches and myalgias   Associated symptoms: no arthralgias, no neck pain, no sinus pain, no sneezing, no swollen glands and no wheezing   Risk factors: no immunosuppression, no recent illness and no sick contacts     Past Medical History:  Diagnosis Date  . DVT (deep venous thrombosis) (HCC)    right leg     Patient Active Problem List   Diagnosis Date Noted  . Vitamin D deficiency 09/09/2016  . Tibia and fibula open fracture, right, type I or II, with nonunion, subsequent encounter 09/08/2016  . S/p tibial fracture   . AKI (acute kidney injury) (HCC)   . Leukocytosis   . Tibia fracture 05/28/2016  . Hypoglycemia 08/10/2013    Past Surgical History:  Procedure Laterality Date  . APPENDECTOMY    . CHOLECYSTECTOMY    . HARDWARE REMOVAL Right 09/08/2016   Procedure: HARDWARE REMOVAL RIGHT TIBIA;  Surgeon: Myrene GalasMichael Handy, MD;  [facility];  Service: Orthopedics;  Laterality: Right;  . I&D EXTREMITY Right 05/28/2016   Procedure: IRRIGATION AND DEBRIDEMENT OF OPEN FRACTURE RIGHT TIBIA;  Surgeon: Venita Lickahari Brooks, MD;  Location: [facility] OR;  Service: Orthopedics;  Laterality: Right;  . TIBIA IM NAIL INSERTION Right 05/28/2016   Procedure: INTRAMEDULLARY (IM) NAIL TIBIAL;  Surgeon: Venita Lickahari Brooks, MD;  Location: [facility] OR;  Service: Orthopedics;  Laterality: Right;  . TIBIA IM NAIL  INSERTION Right 09/08/2016   Procedure: INTRAMEDULLARY (IM) NAIL RIGHT TIBIA;  Surgeon: Myrene GalasMichael Handy, MD;  Location: [facility] OR;  Service: Orthopedics;  Laterality: Right;        Home Medications    Prior to Admission medications   Medication Sig Start Date End Date Taking? Authorizing Provider  cholecalciferol 4000 units TABS Take 4,000 Units by mouth daily. Patient not taking: Reported on 03/26/2018 09/09/16   Montez MoritaPaul, Keith, PA-C  docusate sodium (COLACE) 100 MG capsule Take 1 capsule (100 mg total) by mouth 2 (two) times daily. Patient not taking: Reported on 03/26/2018 09/09/16   Montez MoritaPaul, Keith, PA-C  HYDROcodone-acetaminophen [facility](NORCO) 7.5-325 MG tablet Take 1-2 tablets by mouth every 6 (six) hours as needed for moderate pain or severe pain. Patient not taking: Reported on 03/26/2018 09/09/16   Montez MoritaPaul, Keith, PA-C  ibuprofen (ADVIL,MOTRIN) 200 MG tablet Take 200 mg by mouth daily as needed for moderate pain.    [provider]    Family History Family History  Problem Relation Age of Onset  . Diabetes Mother     Social History Social History   Tobacco Use  . Smoking status: Never Smoker  . Smokeless tobacco: Never Used  Substance Use Topics  . Alcohol use: Yes    Comment: occasionally   . Drug use: No     Allergies   No known allergies   Review of Systems Review of Systems  Constitutional: Positive for fever. Negative for chills.  HENT: Positive for congestion and sore throat. Negative for dental problem, drooling, ear discharge, ear pain, facial swelling, hearing loss, mouth sores, nosebleeds, postnasal drip, rhinorrhea, sinus pain, sneezing, tinnitus, trouble swallowing and voice change.   Eyes: Negative for pain and visual disturbance.  Respiratory: Positive for cough. Negative for shortness of breath and wheezing.   Cardiovascular: Negative for chest pain and palpitations.  Gastrointestinal: Negative for abdominal pain and vomiting.  Genitourinary: Negative for  dysuria and hematuria.  Musculoskeletal: Positive for myalgias. Negative for arthralgias, back pain and neck pain.  Skin: Negative for color change and rash.  Neurological: Positive for headaches. Negative for seizures and syncope.  All other systems reviewed and are negative.    Physical Exam Updated Vital Signs BP (!) 134/91 (BP Location: Right Arm)   Pulse 64   Temp 97.9 F (36.6 C) (Oral)   Resp 16   Ht 5\' 10"  (1.778 m)   Wt 104.3 kg   SpO2 96%   BMI 33.00 kg/m   Physical Exam Vitals signs and nursing note reviewed.  Constitutional:      Appearance: He is well-developed.  HENT:     Head: Normocephalic and atraumatic.     Nose: Nose normal.     Mouth/Throat:     Mouth: Mucous membranes are moist.     Pharynx: Posterior oropharyngeal erythema present. No oropharyngeal exudate.  Eyes:     Conjunctiva/sclera: Conjunctivae normal.     Pupils: Pupils are equal, round, and reactive to light.  Neck:     Musculoskeletal: Normal range of motion and neck supple.  Cardiovascular:     Rate and Rhythm: Normal rate and regular rhythm.     Pulses: Normal pulses.     Heart sounds: Normal heart sounds. No murmur.  Pulmonary:     Effort: Pulmonary effort is normal. No respiratory distress.     Comments: coarse Abdominal:     General: Abdomen is flat. There is no distension.     Palpations: Abdomen is soft. There is no mass.     Tenderness: There is no abdominal tenderness.  Musculoskeletal: Normal range of motion.     Right lower leg: No edema.     Left lower leg: No edema.  Skin:    General: Skin is warm and dry.     Capillary Refill: Capillary refill takes less than 2 seconds.  Neurological:     General: No focal deficit present.     Mental Status: He is alert.  Psychiatric:        Mood and Affect: Mood normal.      ED Treatments / Results  Labs (all labs ordered are listed, but only abnormal results are displayed) Labs Reviewed  GROUP A STREP BY PCR     EKG None  Radiology No results found.  Procedures Procedures (including critical care time)  Medications Ordered in ED Medications  ibuprofen (ADVIL,MOTRIN) tablet 800 mg (has no administration in time range)  dexamethasone (DECADRON) tablet 10 mg (has no administration in time range)     Initial Impression / Assessment and Plan / ED Course  I have reviewed the triage vital signs and the nursing notes.  Pertinent labs & imaging results that were available during my care of the patient were reviewed by me and considered in my medical decision making (see chart for details).     Talon Dishner. is a 43 year old male with no significant medical history presents to the ED with URI symptoms.  Patient has  had body aches, sore throat, cough but no sputum production.  Has had some headaches.  Has used some over-the-counter medications with some relief.  Has not tried any Tylenol or Motrin.  Patient has some erythema to the posterior pharynx but no exudates.  No peritonsillar abscess.  No voice change, no trismus, no drooling.  Patient has some coarse breath sounds on exam.  Suspect likely viral process.  Strep screen and chest x-ray to be ordered.  We will get patient Decadron and Motrin.  Did not get a flu shot this year.  Patient with normal vitals.  No fever.  Chest x-ray showed possible pneumonia, but no pneumothorax, no pleural effusion.  Strep screen is negative.  Suspect viral process but given chest x-ray findings will treat with a Z-Pak. Recommend continued use of Tylenol, Motrin, hydration.  Recommend follow-up with primary care doctor and told to return to the ED if symptoms worsen.  This chart was dictated using voice recognition software.  Despite best efforts to proofread,  errors can occur which can change the documentation meaning.    Final Clinical Impressions(s) / ED Diagnoses   Final diagnoses:  Upper respiratory tract infection, unspecified type    ED Discharge  Orders    None       Virgina Norfolk, DO 12/09/18 1008

## 2018-12-09 NOTE — ED Triage Notes (Signed)
Pt arrives to ED from home with complaints of sore throat and cough since yesterday. Pt placed in position of comfort with bed locked and lowered, call bell in reach.

## 2018-12-11 ENCOUNTER — Emergency Department (HOSPITAL_COMMUNITY)
Admission: EM | Admit: 2018-12-11 | Discharge: 2018-12-11 | Disposition: A | Discharge status: Home / Self Care | Payer: Self-pay | Attending: Emergency Medicine | Admitting: Emergency Medicine

## 2018-12-11 ENCOUNTER — Encounter (HOSPITAL_COMMUNITY): Payer: Self-pay

## 2018-12-11 DIAGNOSIS — J111 Influenza due to unidentified influenza virus with other respiratory manifestations: Secondary | ICD-10-CM | POA: Insufficient documentation

## 2018-12-11 DIAGNOSIS — Z86718 Personal history of other venous thrombosis and embolism: Secondary | ICD-10-CM | POA: Insufficient documentation

## 2018-12-11 DIAGNOSIS — R69 Illness, unspecified: Secondary | ICD-10-CM

## 2018-12-11 LAB — COMPREHENSIVE METABOLIC PANEL
ALT: 49 U/L — ABNORMAL HIGH (ref 0–44)
AST: 35 U/L (ref 15–41)
Albumin: 3.8 g/dL (ref 3.5–5.0)
Alkaline Phosphatase: 56 U/L (ref 38–126)
Anion gap: 10 (ref 5–15)
BILIRUBIN TOTAL: 1 mg/dL (ref 0.3–1.2)
BUN: 14 mg/dL (ref 6–20)
CHLORIDE: 108 mmol/L (ref 98–111)
CO2: 20 mmol/L — AB (ref 22–32)
Calcium: 8.7 mg/dL — ABNORMAL LOW (ref 8.9–10.3)
Creatinine, Ser: 1.33 mg/dL — ABNORMAL HIGH (ref 0.61–1.24)
GFR calc Af Amer: >60 mL/min (ref >60)
GFR calc non Af Amer: >60 mL/min (ref >60)
GLUCOSE: 101 mg/dL — AB (ref 70–99)
POTASSIUM: 4 mmol/L (ref 3.5–5.1)
SODIUM: 138 mmol/L (ref 135–145)
TOTAL PROTEIN: 6.7 g/dL (ref 6.5–8.1)

## 2018-12-11 LAB — RESPIRATORY PANEL BY PCR
ADENOVIRUS-RVPPCR: NOT DETECTED
BORDETELLA PERTUSSIS-RVPCR: NOT DETECTED
CHLAMYDOPHILA PNEUMONIAE-RVPPCR: NOT DETECTED
CORONAVIRUS NL63-RVPPCR: DETECTED — AB
Coronavirus 229E: NOT DETECTED
Coronavirus HKU1: NOT DETECTED
Coronavirus OC43: NOT DETECTED
INFLUENZA A H1 2009-RVPPR: DETECTED — AB
Influenza B: NOT DETECTED
MYCOPLASMA PNEUMONIAE-RVPPCR: NOT DETECTED
Metapneumovirus: NOT DETECTED
PARAINFLUENZA VIRUS 1-RVPPCR: NOT DETECTED
PARAINFLUENZA VIRUS 2-RVPPCR: NOT DETECTED
PARAINFLUENZA VIRUS 3-RVPPCR: NOT DETECTED
Parainfluenza Virus 4: NOT DETECTED
Respiratory Syncytial Virus: NOT DETECTED
Rhinovirus / Enterovirus: NOT DETECTED

## 2018-12-11 LAB — CBC WITH DIFFERENTIAL/PLATELET
ABS IMMATURE GRANULOCYTES: 0.03 10*3/uL (ref 0.00–0.07)
Basophils Absolute: 0.1 10*3/uL (ref 0.0–0.1)
Basophils Relative: 1 %
Eosinophils Absolute: 0.2 10*3/uL (ref 0.0–0.5)
Eosinophils Relative: 2 %
HCT: 50.3 % (ref 39.0–52.0)
Hemoglobin: 16.9 g/dL (ref 13.0–17.0)
Immature Granulocytes: 0 %
Lymphocytes Relative: 21 %
Lymphs Abs: 2.1 10*3/uL (ref 0.7–4.0)
MCH: 27.3 pg (ref 26.0–34.0)
MCHC: 33.6 g/dL (ref 30.0–36.0)
MCV: 81.1 fL (ref 80.0–100.0)
MONO ABS: 1 10*3/uL (ref 0.1–1.0)
Monocytes Relative: 10 %
Neutro Abs: 6.9 10*3/uL (ref 1.7–7.7)
Neutrophils Relative %: 66 %
Platelets: 208 10*3/uL (ref 150–400)
RBC: 6.2 MIL/uL — ABNORMAL HIGH (ref 4.22–5.81)
RDW: 12.8 % (ref 11.5–15.5)
WBC: 10.3 10*3/uL (ref 4.0–10.5)
nRBC: 0 % (ref 0.0–0.2)

## 2018-12-11 MED ORDER — ACETAMINOPHEN 500 MG PO TABS
1000.0000 mg | ORAL_TABLET | Freq: Once | ORAL | Status: AC
  Administered 2018-12-11: 1000 mg via ORAL
  Filled 2018-12-11: qty 2

## 2018-12-11 MED ORDER — KETOROLAC TROMETHAMINE 30 MG/ML IJ SOLN
15.0000 mg | Freq: Once | INTRAMUSCULAR | Status: AC
  Administered 2018-12-11: 15 mg via INTRAVENOUS
  Filled 2018-12-11: qty 1

## 2018-12-11 MED ORDER — SODIUM CHLORIDE 0.9 % IV BOLUS
1000.0000 mL | Freq: Once | INTRAVENOUS | Status: AC
  Administered 2018-12-11: 1000 mL via INTRAVENOUS

## 2018-12-11 MED ORDER — DEXAMETHASONE SODIUM PHOSPHATE 10 MG/ML IJ SOLN
10.0000 mg | Freq: Once | INTRAMUSCULAR | Status: AC
  Administered 2018-12-11: 10 mg via INTRAVENOUS
  Filled 2018-12-11: qty 1

## 2018-12-11 NOTE — Discharge Instructions (Addendum)
Take dayquil/ nyquil for symptom relief Drink at least 100 oz of water daily while you are sick. Follow up with your primary care doctor in the next 2 days.  Continue to take your antibiotic.  Get help right away if: You develop shortness of breath or difficulty breathing. Your skin or nails turn a bluish color. You have severe pain or stiffness in your neck. You develop a sudden headache or sudden pain in your face or ear. You cannot eat or drink without vomiting.

## 2018-12-11 NOTE — ED Triage Notes (Signed)
Pt c.o flu like symptoms since Thursday (cough, congestion, gen body aches). No relief from OTC medications.

## 2018-12-11 NOTE — ED Provider Notes (Signed)
[name] EMERGENCY DEPARTMENT Provider Note   CSN: [id number] Arrival date & time: 12/11/18  1445     History   Chief Complaint Chief Complaint  Patient presents with  . Generalized Body Aches  . Cough    HPI Roberto Bates. is a 43 y.o. male who presents the emergency department with flulike symptoms.  Patient was seen for the same complaint on 12/09/2018.  At that time he had mild sore throat, productive cough, body aches and headache.  He had taken over-the-counter medications which at that time per the note he states gave him some relief however today he says he has had no relief.  Chest x-ray done showed possible new pneumonia likely viral however he was treated with a Z-Pak.  Patient returns today stating that he is worse than he was when he came in.  He has not been taking any symptomatic [place].  He denies wheezing.  He is a non-smoker.  HPI  Past Medical History:  Diagnosis Date  . DVT (deep venous thrombosis) (HCC)    right leg     Patient Active Problem List   Diagnosis Date Noted  . Vitamin D deficiency 09/09/2016  . Tibia and fibula open fracture, right, type I or II, with nonunion, subsequent encounter 09/08/2016  . S/p tibial fracture   . AKI (acute kidney injury) (HCC)   . Leukocytosis   . Tibia fracture 05/28/2016  . Hypoglycemia 08/10/2013    Past Surgical History:  Procedure Laterality Date  . APPENDECTOMY    . CHOLECYSTECTOMY    . HARDWARE REMOVAL Right 09/08/2016   Procedure: HARDWARE REMOVAL RIGHT TIBIA;  Surgeon: Myrene Galas, MD;  Location: [facility] OR;  Service: Orthopedics;  Laterality: Right;  . I&D EXTREMITY Right 05/28/2016   Procedure: IRRIGATION AND DEBRIDEMENT OF OPEN FRACTURE RIGHT TIBIA;  Surgeon: Venita Lick, MD;  Location: [facility] OR;  Service: Orthopedics;  Laterality: Right;  . TIBIA IM NAIL INSERTION Right 05/28/2016   Procedure: INTRAMEDULLARY (IM) NAIL TIBIAL;  Surgeon: Venita Lick, MD;  Location: [facility] OR;  Service:  Orthopedics;  Laterality: Right;  . TIBIA IM NAIL INSERTION Right 09/08/2016   Procedure: INTRAMEDULLARY (IM) NAIL RIGHT TIBIA;  Surgeon: Myrene Galas, MD;  Location: [facility] OR;  Service: Orthopedics;  Laterality: Right;        Home Medications    Prior to Admission medications   Medication Sig Start Date End Date Taking? Authorizing Provider  azithromycin (ZITHROMAX) 250 MG tablet Take 1 tablet (250 mg total) by mouth daily. Take first 2 tablets together, then 1 every day until finished. 12/09/18   Curatolo, Adam, DO  cholecalciferol 4000 units TABS Take 4,000 Units by mouth daily. Patient not taking: Reported on 03/26/2018 09/09/16   Montez Morita, PA-C  docusate sodium (COLACE) 100 MG capsule Take 1 capsule (100 mg total) by mouth 2 (two) times daily. Patient not taking: Reported on 03/26/2018 09/09/16   Montez Morita, PA-C  HYDROcodone-acetaminophen [facility]) 7.5-325 MG tablet Take 1-2 tablets by mouth every 6 (six) hours as needed for moderate pain or severe pain. Patient not taking: Reported on 03/26/2018 09/09/16   Montez Morita, PA-C  ibuprofen (ADVIL,MOTRIN) 200 MG tablet Take 200 mg by mouth daily as needed for moderate pain.    [provider]    Family History Family History  Problem Relation Age of Onset  . Diabetes Mother     Social History Social History   Tobacco Use  . Smoking status: Never Smoker  .  Smokeless tobacco: Never Used  Substance Use Topics  . Alcohol use: Yes    Comment: occasionally   . Drug use: No     Allergies   No known allergies   Review of Systems Review of Systems Ten systems reviewed and are negative for acute change, except as noted in the HPI.   Physical Exam Updated Vital Signs BP (!) 127/95 (BP Location: Right Arm)   Pulse (!) 108   Temp 99.2 F (37.3 C) (Oral)   Resp (!) 24   SpO2 96%   Physical Exam Vitals signs and nursing note reviewed.  Constitutional:      General: He is not in acute distress.    Appearance: He is  well-developed. He is ill-appearing. He is not toxic-appearing or diaphoretic.  HENT:     Head: Normocephalic and atraumatic.     Mouth/Throat:     Comments: Posterior pharyngeal erythema without exudates, uvula midline Eyes:     General: No scleral icterus.    Extraocular Movements: Extraocular movements intact.     Conjunctiva/sclera: Conjunctivae normal.     Pupils: Pupils are equal, round, and reactive to light.  Neck:     Musculoskeletal: Normal range of motion and neck supple.  Cardiovascular:     Rate and Rhythm: Regular rhythm. Tachycardia present.     Heart sounds: Normal heart sounds.  Pulmonary:     Effort: Pulmonary effort is normal. No respiratory distress.     Breath sounds: Wheezing present.     Comments: Mild expiratory wheeze with deep coughing Abdominal:     Palpations: Abdomen is soft.     Tenderness: There is no abdominal tenderness.  Skin:    General: Skin is warm and dry.  Neurological:     Mental Status: He is alert.  Psychiatric:        Behavior: Behavior normal.      ED Treatments / Results  Labs (all labs ordered are listed, but only abnormal results are displayed) Labs Reviewed  RESPIRATORY PANEL BY PCR    EKG None  Radiology No results found.  Procedures Procedures (including critical care time)  Medications Ordered in ED Medications  sodium chloride 0.9 % bolus 1,000 mL (1,000 mLs Intravenous New Bag/Given 12/11/18 1537)  ketorolac (TORADOL) 30 MG/ML injection 15 mg (15 mg Intravenous Given 12/11/18 1548)  dexamethasone (DECADRON) injection 10 mg (10 mg Intravenous Given 12/11/18 1548)  acetaminophen (TYLENOL) tablet 1,000 mg (1,000 mg Oral Given 12/11/18 1547)     Initial Impression / Assessment and Plan / ED Course  I have reviewed the triage vital signs and the nursing notes.  Pertinent labs & imaging results that were available during my care of the patient were reviewed by me and considered in my medical decision making (see chart  for details).  Clinical Course as of Dec 11 2321  Sun Dec 11, 2018  1709 WBC: 10.3 [AH]  1709 Creatinine(!): 1.33 [AH]    Clinical Course User Index [AH] Roberto Captain, PA-C    Patient with influenza-like illness.  Diagnosed with community-acquired pneumonia.  Patient treated with fluids, half dose of Toradol, Decadron, Tylenol with complete resolution of his abnormal vital signs and improvement in his symptoms.  Do not feel that there is any other interventions to complete at this time.  He has a normal white blood cell count.  Is a slightly elevated creatinine but just above normal and not significant.  The patient's respiratory panel shows positive influenza A and coronavirus.  Patient will need to continue with home supportive care.  He may follow-up with his primary care physician.  Was appropriate for discharge at this time  Final Clinical Impressions(s) / ED Diagnoses   Final diagnoses:  Influenza-like illness    ED Discharge Orders    None       Roberto Bates, Roberto Jergens, PA-C 12/11/18 2325    Margarita Grizzleay, Danielle, MD 12/20/18 1146

## 2018-12-11 NOTE — ED Notes (Signed)
Patient verbalizes understanding of discharge instructions. Opportunity for questioning and answers were provided. Armband removed by staff, pt discharged from ED. Pt ambulatory to lobby.  

## 2018-12-13 ENCOUNTER — Encounter (HOSPITAL_COMMUNITY): Payer: Self-pay | Admitting: Emergency Medicine

## 2018-12-13 ENCOUNTER — Emergency Department (HOSPITAL_COMMUNITY)
Admission: EM | Admit: 2018-12-13 | Discharge: 2018-12-13 | Disposition: A | Discharge status: Home / Self Care | Payer: Self-pay | Attending: Emergency Medicine | Admitting: Emergency Medicine

## 2018-12-13 DIAGNOSIS — R52 Pain, unspecified: Secondary | ICD-10-CM | POA: Insufficient documentation

## 2018-12-13 DIAGNOSIS — Z5321 Procedure and treatment not carried out due to patient leaving prior to being seen by health care provider: Secondary | ICD-10-CM | POA: Insufficient documentation

## 2018-12-13 NOTE — ED Triage Notes (Signed)
Per pt, states he has been to [facility] twice for same symptoms-states body aches, chills-states he has finished Z-Pack and doesn't feel any better

## 2019-11-14 ENCOUNTER — Ambulatory Visit (HOSPITAL_COMMUNITY)
Admission: EM | Admit: 2019-11-14 | Discharge: 2019-11-14 | Disposition: A | Discharge status: Home / Self Care | Payer: Self-pay

## 2019-11-14 ENCOUNTER — Encounter (HOSPITAL_COMMUNITY): Payer: Self-pay | Admitting: *Deleted

## 2019-11-14 ENCOUNTER — Other Ambulatory Visit: Payer: Self-pay

## 2019-11-14 ENCOUNTER — Emergency Department (HOSPITAL_COMMUNITY)
Admission: EM | Admit: 2019-11-14 | Discharge: 2019-11-14 | Disposition: A | Discharge status: Home / Self Care | Payer: Self-pay | Attending: Emergency Medicine | Admitting: Emergency Medicine

## 2019-11-14 DIAGNOSIS — Z5321 Procedure and treatment not carried out due to patient leaving prior to being seen by health care provider: Secondary | ICD-10-CM | POA: Insufficient documentation

## 2019-11-14 DIAGNOSIS — R05 Cough: Secondary | ICD-10-CM | POA: Insufficient documentation

## 2019-11-14 DIAGNOSIS — J029 Acute pharyngitis, unspecified: Secondary | ICD-10-CM | POA: Insufficient documentation

## 2019-11-14 LAB — POC SARS CORONAVIRUS 2 AG -  ED: SARS Coronavirus 2 Ag: NEGATIVE

## 2019-11-14 NOTE — ED Triage Notes (Signed)
Pt reports having cough and cold symptoms since last night. Reports cough is productive with yellow sputum, sore throat and chills.

## 2020-10-05 ENCOUNTER — Inpatient Hospital Stay (HOSPITAL_COMMUNITY)
Admission: EM | Admit: 2020-10-05 | Discharge: 2020-10-07 | DRG: 177 | Disposition: A | Discharge status: Home / Self Care | Payer: HRSA Program | Attending: Internal Medicine | Admitting: Internal Medicine

## 2020-10-05 ENCOUNTER — Emergency Department (HOSPITAL_COMMUNITY): Payer: HRSA Program

## 2020-10-05 ENCOUNTER — Encounter (HOSPITAL_COMMUNITY): Payer: Self-pay | Admitting: *Deleted

## 2020-10-05 ENCOUNTER — Other Ambulatory Visit: Payer: Self-pay

## 2020-10-05 DIAGNOSIS — Z23 Encounter for immunization: Secondary | ICD-10-CM

## 2020-10-05 DIAGNOSIS — N179 Acute kidney failure, unspecified: Secondary | ICD-10-CM | POA: Diagnosis present

## 2020-10-05 DIAGNOSIS — U071 COVID-19: Secondary | ICD-10-CM | POA: Diagnosis not present

## 2020-10-05 DIAGNOSIS — J9601 Acute respiratory failure with hypoxia: Secondary | ICD-10-CM | POA: Diagnosis present

## 2020-10-05 DIAGNOSIS — J1282 Pneumonia due to coronavirus disease 2019: Secondary | ICD-10-CM | POA: Diagnosis present

## 2020-10-05 LAB — URINALYSIS, ROUTINE W REFLEX MICROSCOPIC
Bilirubin Urine: NEGATIVE
Glucose, UA: NEGATIVE mg/dL
Ketones, ur: NEGATIVE mg/dL
Leukocytes,Ua: NEGATIVE
Nitrite: NEGATIVE
Protein, ur: 30 mg/dL — AB
Specific Gravity, Urine: 1.03 (ref 1.005–1.030)
pH: 5 (ref 5.0–8.0)

## 2020-10-05 LAB — CBC
HCT: 54.1 % — ABNORMAL HIGH (ref 39.0–52.0)
Hemoglobin: 17.9 g/dL — ABNORMAL HIGH (ref 13.0–17.0)
MCH: 27 pg (ref 26.0–34.0)
MCHC: 33.1 g/dL (ref 30.0–36.0)
MCV: 81.6 fL (ref 80.0–100.0)
Platelets: 164 10*3/uL (ref 150–400)
RBC: 6.63 MIL/uL — ABNORMAL HIGH (ref 4.22–5.81)
RDW: 12.2 % (ref 11.5–15.5)
WBC: 4.5 10*3/uL (ref 4.0–10.5)
nRBC: 0 % (ref 0.0–0.2)

## 2020-10-05 LAB — COMPREHENSIVE METABOLIC PANEL
ALT: 41 U/L (ref 0–44)
AST: 26 U/L (ref 15–41)
Albumin: 4.2 g/dL (ref 3.5–5.0)
Alkaline Phosphatase: 66 U/L (ref 38–126)
Anion gap: 11 (ref 5–15)
BUN: 15 mg/dL (ref 6–20)
CO2: 21 mmol/L — ABNORMAL LOW (ref 22–32)
Calcium: 8.8 mg/dL — ABNORMAL LOW (ref 8.9–10.3)
Chloride: 105 mmol/L (ref 98–111)
Creatinine, Ser: 1.4 mg/dL — ABNORMAL HIGH (ref 0.61–1.24)
GFR, Estimated: >60 mL/min (ref >60)
Glucose, Bld: 98 mg/dL (ref 70–99)
Potassium: 3.7 mmol/L (ref 3.5–5.1)
Sodium: 137 mmol/L (ref 135–145)
Total Bilirubin: 0.9 mg/dL (ref 0.3–1.2)
Total Protein: 7.5 g/dL (ref 6.5–8.1)

## 2020-10-05 LAB — LIPASE, BLOOD: Lipase: 25 U/L (ref 11–51)

## 2020-10-05 MED ORDER — ONDANSETRON HCL 4 MG/2ML IJ SOLN
4.0000 mg | Freq: Once | INTRAMUSCULAR | Status: AC
  Administered 2020-10-06: 4 mg via INTRAVENOUS
  Filled 2020-10-05: qty 2

## 2020-10-05 MED ORDER — MORPHINE SULFATE (PF) 4 MG/ML IV SOLN
4.0000 mg | Freq: Once | INTRAVENOUS | Status: AC
  Administered 2020-10-06: 4 mg via INTRAVENOUS
  Filled 2020-10-05: qty 1

## 2020-10-05 NOTE — ED Triage Notes (Signed)
The pt is c/o abd pain with nausea vomiting diarrhea  Fever unknown no tylenol or advil

## 2020-10-05 NOTE — ED Provider Notes (Signed)
[facility] EMERGENCY DEPARTMENT Provider Note   CSN: [id number] Arrival date & time: 10/05/20  2020     History Chief Complaint  Patient presents with  . Abdominal Pain    Roberto Bates. is a 44 y.o. 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 Patient states discomfort is primarily in the generalized abdomen, feels like a tightness, waxes and wanes in severity, no significant alleviating or aggravating factors.  He is having associated diarrhea that is nonbloody, multiple episodes per day.  States the pain in his belly also seems to be in his chest, having a hard time describing this pain.  Has had nausea without vomiting.  He also states that he has had dry cough, dyspnea, and subjective fever/chills. Denies fevers, emesis, bright red blood per rectum, dysuria, testicular pain/swelling, leg pain/swelling, hemoptysis, recent surgery/trauma, recent long travel, hormone use, or hx of cancer.  He is status post cholecystectomy and appendectomy.  Denies recent foreign travel or antibiotics.  HPI     Past Medical History:  Diagnosis Date  . DVT (deep venous thrombosis) (HCC)    right leg     Patient Active Problem List   Diagnosis Date Noted  . Vitamin D deficiency 09/09/2016  . Tibia and fibula open fracture, right, type I or II, with nonunion, subsequent encounter 09/08/2016  . S/p tibial fracture   . AKI (acute kidney injury) (HCC)   . Leukocytosis   . Tibia fracture 05/28/2016  . Hypoglycemia 08/10/2013    Past Surgical History:  Procedure Laterality Date  . APPENDECTOMY    . CHOLECYSTECTOMY    . HARDWARE REMOVAL Right 09/08/2016   Procedure: HARDWARE REMOVAL RIGHT TIBIA;  Surgeon: Myrene Galas, MD;  Location: [facility] OR;  Service: Orthopedics;  Laterality: Right;  . I & D EXTREMITY Right 05/28/2016   Procedure: IRRIGATION AND  DEBRIDEMENT OF OPEN FRACTURE RIGHT TIBIA;  Surgeon: Venita Lick, MD;  Location: [facility] OR;  Service: Orthopedics;  Laterality: Right;  . TIBIA IM NAIL INSERTION Right 05/28/2016   Procedure: INTRAMEDULLARY (IM) NAIL TIBIAL;  Surgeon: Venita Lick, MD;  Location: [facility] OR;  Service: Orthopedics;  Laterality: Right;  . TIBIA IM NAIL INSERTION Right 09/08/2016   Procedure: INTRAMEDULLARY (IM) NAIL RIGHT TIBIA;  Surgeon: Myrene Galas, MD;  Location: [facility] OR;  Service: Orthopedics;  Laterality: Right;       Family History  Problem Relation Age of Onset  . Diabetes Mother     Social History   Tobacco Use  . Smoking status: Never Smoker  . Smokeless tobacco: Never Used  Substance Use Topics  . Alcohol use: Yes    Comment: occasionally   . Drug use: No    Home Medications Prior to Admission medications   Medication Sig Start Date End Date Taking? Authorizing Provider  azithromycin (ZITHROMAX) 250 MG tablet Take 1 tablet (250 mg total) by mouth daily. Take first 2 tablets together, then 1 every day until finished. 12/09/18   Curatolo, Adam, DO  cholecalciferol 4000 units TABS Take 4,000 Units by mouth daily. Patient not taking: Reported on 03/26/2018 09/09/16   Montez Morita, PA-C  docusate sodium (COLACE) 100 MG capsule Take 1 capsule (100 mg total) by mouth 2 (two) times daily. Patient not taking: Reported on 03/26/2018 09/09/16   Montez Morita, PA-C  HYDROcodone-acetaminophen [facility]) 7.5-325 MG tablet Take 1-2 tablets by mouth every 6 (six) hours  as needed for moderate pain or severe pain. Patient not taking: Reported on 03/26/2018 09/09/16   Montez Morita, PA-C  ibuprofen (ADVIL,MOTRIN) 200 MG tablet Take 200 mg by mouth daily as needed for moderate pain.    [provider]    Allergies    No known allergies  Review of Systems   Review of Systems  Constitutional: Positive for chills and fever.  HENT: Negative for congestion, ear pain and sore throat.   Respiratory: Positive for cough  (mild) and shortness of breath.   Cardiovascular: Positive for chest pain. Negative for leg swelling.  Gastrointestinal: Positive for abdominal pain, diarrhea and nausea. Negative for vomiting.  Genitourinary: Negative for dysuria, scrotal swelling and testicular pain.  Neurological: Negative for syncope.  All other systems reviewed and are negative.   Physical Exam Updated Vital Signs BP (!) 155/99 (BP Location: Right Arm)   Pulse 97   Temp 98.7 F (37.1 C) (Oral)   Resp 18   Ht 5\' 10"  (1.778 m)   Wt 106.6 kg   SpO2 99%   BMI 33.72 kg/m   Physical Exam Vitals and nursing note reviewed.  Constitutional:      General: He is not in acute distress.    Appearance: He is well-developed. He is not toxic-appearing.  HENT:     Head: Normocephalic and atraumatic.  Eyes:     General:        Right eye: No discharge.        Left eye: No discharge.     Conjunctiva/sclera: Conjunctivae normal.  Cardiovascular:     Rate and Rhythm: Normal rate and regular rhythm.     Comments: 2+ symmetric radial pulses. Pulmonary:     Effort: Tachypnea present. No respiratory distress.     Breath sounds: Rhonchi (very mild ) present. No wheezing or rales.     Comments: SpO2 90-94% on RA on my evaluation.  Abdominal:     General: There is no distension.     Palpations: Abdomen is soft.     Tenderness: There is generalized abdominal tenderness. There is no guarding or rebound.     Comments: No focal tenderness to palpation.   Musculoskeletal:     Cervical back: Neck supple.     Comments: No significant lower extremity edema.  Skin:    General: Skin is warm and dry.     Findings: No rash.  Neurological:     Mental Status: He is alert.     Comments: Clear speech.   Psychiatric:        Behavior: Behavior normal.    ED Results / Procedures / Treatments   Labs (all labs ordered are listed, but only abnormal results are displayed) Labs Reviewed  RESP PANEL BY RT-PCR (FLU A&B, COVID) ARPGX2 -  Abnormal; Notable for the following components:      Result Value   SARS Coronavirus 2 by RT PCR POSITIVE (*)    All other components within normal limits  COMPREHENSIVE METABOLIC PANEL - Abnormal; Notable for the following components:   CO2 21 (*)    Creatinine, Ser 1.40 (*)    Calcium 8.8 (*)    All other components within normal limits  CBC - Abnormal; Notable for the following components:   RBC 6.63 (*)    Hemoglobin 17.9 (*)    HCT 54.1 (*)    All other components within normal limits  URINALYSIS, ROUTINE W REFLEX MICROSCOPIC - Abnormal; Notable for the following components:  Hgb urine dipstick SMALL (*)    Protein, ur 30 (*)    Bacteria, UA RARE (*)    All other components within normal limits  LIPASE, BLOOD  BRAIN NATRIURETIC PEPTIDE  TROPONIN I (HIGH SENSITIVITY)  TROPONIN I (HIGH SENSITIVITY)    EKG EKG Interpretation  Date/Time:  Saturday October 05 2020 20:35:56 EST Ventricular Rate:  104 PR Interval:  156 QRS Duration: 90 QT Interval:  288 QTC Calculation: 378 R Axis:   77 Text Interpretation: Sinus tachycardia Cannot rule out Inferior infarct , age undetermined ST & T wave abnormality, consider anterolateral ischemia Abnormal ECG No significant change since last tracing Confirmed by Zadie RhineWickline, Donald ([id number]) on 10/05/2020 11:37:24 PM   Radiology CT Angio Chest PE W/Cm &/Or Wo Cm  Result Date: 10/06/2020 CLINICAL DATA:  Chest pain, difficulty breathing, abdominal pain with diarrhea. EXAM: CT ANGIOGRAPHY CHEST CT ABDOMEN AND PELVIS WITH CONTRAST TECHNIQUE: Multidetector CT imaging of the chest was performed using the standard protocol during bolus administration of intravenous contrast. Multiplanar CT image reconstructions and MIPs were obtained to evaluate the vascular anatomy. Multidetector CT imaging of the abdomen and pelvis was performed using the standard protocol during bolus administration of intravenous contrast. CONTRAST:  100mL OMNIPAQUE IOHEXOL 350  MG/ML SOLN COMPARISON:  None. FINDINGS: CTA CHEST FINDINGS Cardiovascular: Good opacification of the central and segmental pulmonary arteries. No focal filling defects. No evidence of significant pulmonary embolus. Normal caliber thoracic aorta. No aortic dissection. Great vessel origins are patent. Normal heart size. No pericardial effusions. Mediastinum/Nodes: Esophagus is decompressed. No significant lymphadenopathy. Prominent mediastinal fat in the chest. Lungs/Pleura: Motion artifact limits examination. Bilateral perihilar and basilar airspace infiltrates. This may represent edema or multifocal pneumonia. Aspiration would be another possibility. Airways are patent. No pleural effusions. No pneumothorax. Musculoskeletal: No chest wall abnormality. No acute or significant osseous findings. Review of the MIP images confirms the above findings. CT ABDOMEN and PELVIS FINDINGS Hepatobiliary: Surgical absence of the gallbladder. No bile duct dilatation. Cyst in the right lobe of the liver. Mild diffuse fatty infiltration of the liver. Pancreas: Unremarkable. No pancreatic ductal dilatation or surrounding inflammatory changes. Spleen: Normal in size without focal abnormality. Adrenals/Urinary Tract: Adrenal glands are unremarkable. Kidneys are normal, without renal calculi, focal lesion, or hydronephrosis. Bladder is unremarkable. Stomach/Bowel: Stomach, small bowel, and colon are decompressed. No wall thickening or inflammatory changes are appreciated. Appendix is not identified. Vascular/Lymphatic: No significant vascular findings are present. No enlarged abdominal or pelvic lymph nodes. Reproductive: Prostate is unremarkable. Other: No abdominal wall hernia or abnormality. No abdominopelvic ascites. Musculoskeletal: Limbus vertebra at the superior endplate of L4, consistent with chronic or congenital finding. No acute bony abnormalities identified. Review of the MIP images confirms the above findings. IMPRESSION: 1.  No evidence of significant pulmonary embolus. 2. Bilateral perihilar and basilar airspace infiltrates may represent edema or multifocal pneumonia. Aspiration would be another possibility. 3. No acute process demonstrated in the abdomen or pelvis. 4. Mild diffuse fatty infiltration of the liver. Electronically Signed   By: Burman NievesWilliam  Stevens M.D.   On: 10/06/2020 03:39   CT Abdomen Pelvis W Contrast  Result Date: 10/06/2020 CLINICAL DATA:  Chest pain, difficulty breathing, abdominal pain with diarrhea. EXAM: CT ANGIOGRAPHY CHEST CT ABDOMEN AND PELVIS WITH CONTRAST TECHNIQUE: Multidetector CT imaging of the chest was performed using the standard protocol during bolus administration of intravenous contrast. Multiplanar CT image reconstructions and MIPs were obtained to evaluate the vascular anatomy. Multidetector CT imaging of the abdomen and pelvis  was performed using the standard protocol during bolus administration of intravenous contrast. CONTRAST:  OMNIPAQUE IOHEXOL 350 MG/ML SOLN COMPARISON:  None. FINDINGS: CTA CHEST FINDINGS Cardiovascular: Good opacification of the central and segmental pulmonary arteries. No focal filling defects. No evidence of significant pulmonary embolus. Normal caliber thoracic aorta. No aortic dissection. Great vessel origins are patent. Normal heart size. No pericardial effusions. Mediastinum/Nodes: Esophagus is decompressed. No significant lymphadenopathy. Prominent mediastinal fat in the chest. Lungs/Pleura: Motion artifact limits examination. Bilateral perihilar and basilar airspace infiltrates. This may represent edema or multifocal pneumonia. Aspiration would be another possibility. Airways are patent. No pleural effusions. No pneumothorax. Musculoskeletal: No chest wall abnormality. No acute or significant osseous findings. Review of the MIP images confirms the above findings. CT ABDOMEN and PELVIS FINDINGS Hepatobiliary: Surgical absence of the gallbladder. No bile duct  dilatation. Cyst in the right lobe of the liver. Mild diffuse fatty infiltration of the liver. Pancreas: Unremarkable. No pancreatic ductal dilatation or surrounding inflammatory changes. Spleen: Normal in size without focal abnormality. Adrenals/Urinary Tract: Adrenal glands are unremarkable. Kidneys are normal, without renal calculi, focal lesion, or hydronephrosis. Bladder is unremarkable. Stomach/Bowel: Stomach, small bowel, and colon are decompressed. No wall thickening or inflammatory changes are appreciated. Appendix is not identified. Vascular/Lymphatic: No significant vascular findings are present. No enlarged abdominal or pelvic lymph nodes. Reproductive: Prostate is unremarkable. Other: No abdominal wall hernia or abnormality. No abdominopelvic ascites. Musculoskeletal: Limbus vertebra at the superior endplate of L4, consistent with chronic or congenital finding. No acute bony abnormalities identified. Review of the MIP images confirms the above findings. IMPRESSION: 1. No evidence of significant pulmonary embolus. 2. Bilateral perihilar and basilar airspace infiltrates may represent edema or multifocal pneumonia. Aspiration would be another possibility. 3. No acute process demonstrated in the abdomen or pelvis. 4. Mild diffuse fatty infiltration of the liver. Electronically Signed   By: Burman Nieves M.D.   On: 10/06/2020 03:39   DG Chest Portable 1 View  Result Date: 10/06/2020 CLINICAL DATA:  Abdominal pain and diarrhea EXAM: PORTABLE CHEST 1 VIEW COMPARISON:  12/09/2018 FINDINGS: Shallow inspiration. Probable atelectasis in the lung bases. Cardiac enlargement. No vascular congestion or edema. No pleural effusions. No pneumothorax. Mediastinal contours appear intact. Surgical clips in the right upper quadrant. IMPRESSION: Shallow inspiration with probable atelectasis in the lung bases. Cardiac enlargement. Electronically Signed   By: Burman Nieves M.D.   On: 10/06/2020 00:04     Procedures .Critical Care Performed by: Cherly Anderson, PA-C Authorized by: Cherly Anderson, PA-C     CRITICAL CARE Performed by: Harvie Heck   Total critical care time: 50 minutes  Critical care time was exclusive of separately billable procedures and treating other patients.  Critical care was necessary to treat or prevent imminent or life-threatening deterioration.  Critical care was time spent personally by me on the following activities: development of treatment plan with patient and/or surrogate as well as nursing, discussions with consultants, evaluation of patient's response to treatment, examination of patient, obtaining history from patient or surrogate, ordering and performing treatments and interventions, ordering and review of laboratory studies, ordering and review of radiographic studies, pulse oximetry and re-evaluation of patient's condition. (including critical care time)  Medications Ordered in ED Medications  remdesivir 200 mg in sodium chloride 0.9% 250 mL IVPB (200 mg Intravenous New Bag/Given 10/06/20 0719)  remdesivir 100 mg in sodium chloride 0.9 % 100 mL IVPB (has no administration in time range)  morphine 4 MG/ML injection 4 mg (  4 mg Intravenous Given 10/06/20 0029)  ondansetron (ZOFRAN) injection 4 mg (4 mg Intravenous Given 10/06/20 0029)  sodium chloride 0.9 % bolus 500 mL (0 mLs Intravenous Stopped 10/06/20 0100)  HYDROmorphone (DILAUDID) injection 1 mg (1 mg Intravenous Given 10/06/20 0209)  sodium chloride 0.9 % bolus 500 mL (0 mLs Intravenous Stopped 10/06/20 0309)  iohexol (OMNIPAQUE) 350 MG/ML injection 100 mL (100 mLs Intravenous Contrast Given 10/06/20 0254)  alum & mag hydroxide-simeth (MAALOX/MYLANTA) 200-200-20 MG/5ML suspension 30 mL (30 mLs Oral Given 10/06/20 0405)  dicyclomine (BENTYL) capsule 10 mg (10 mg Oral Given 10/06/20 0405)  doxycycline (VIBRA-TABS) tablet 100 mg (100 mg Oral Given 10/06/20 0405)   dexamethasone (DECADRON) injection 6 mg (6 mg Intravenous Given 10/06/20 0700)    ED Course  I have reviewed the triage vital signs and the nursing notes.  Pertinent labs & imaging results that were available during my care of the patient were reviewed by me and considered in my medical decision making (see chart for details).    MDM Rules/Calculators/A&P                         Patient presents to the ED with complaints of abdominal pain, chest discomfort & diarrhea.  Patient is nontoxic appearing, his vitals are notable for borderline SpO2 @ rest while I am in the exam room.  He has minimal occasional rhonchi noted. He has no focal abdominal tenderness or peritoneal signs.  Lungs are clear.  Additional history obtained:  Additional history obtained from chart review nursing note reviewed. Lab Tests:  I reviewed and interpreted labs, which included:  CBC: Elevated hemoglobin/hematocrit, no anemia.  No leukocytosis. CMP: Mild elevation in creatinine compared to prior.  LFTs are within normal limits Lipase: Within normal limits Urinalysis: No gross infection, no urinary symptoms. Troponin: No significant elevation.   EKG: No significant change compared to last tracing.   Imaging Studies ordered:  I ordered imaging studies which included CXR, I independently visualized and interpreted imaging which showed Shallow inspiration with probable atelectasis in the lung bases. Cardiac enlargement.   ED Course:  02:00; RE-EVAL: Continued pain, not much relief. Plan for dilaudid & CTA of the chest & CT A/P.   CTA: 1. No evidence of significant pulmonary embolus. 2. Bilateral perihilar and basilar airspace infiltrates may represent edema or multifocal pneumonia. Aspiration would be another possibility. 3. No acute process demonstrated in the abdomen or pelvis. 4. Mild diffuse fatty infiltration of the liver.   Patient without PE, no acute intra-abdominal process, does have findings concerning  for edema v.s multifocal pneumonia, aspiration also possibility per radiology read. Initially gave doxycycline for CAP coverage with COVID testing ordered, however patient with desaturation with ambulation on RA therefore will require admission, COVID testing subsequently positive, patient states he has had sxs for about 3 days, he is unvaccinated. Will start decadron & remdesivir, and consult with hospitalist service for admission.   Findings and plan of care discussed with supervising physician Dr. Bebe Shaggy who has evaluated patient & is in agreement.   07:38: CONSULT: Discussed with Dr. Mcarthur Rossetti with IM teaching service- accepts admission.   Portions of this note were generated with Scientist, clinical (histocompatibility and immunogenetics). Dictation errors may occur despite best attempts at proofreading.  Final Clinical Impression(s) / ED Diagnoses Final diagnoses:  Acute hypoxemic respiratory failure (HCC)  COVID-19    Rx / DC Orders ED Discharge Orders    None  Desmond Lope 10/06/20 0739    Zadie Rhine, MD 10/06/20 [phone]

## 2020-10-06 ENCOUNTER — Emergency Department (HOSPITAL_COMMUNITY): Payer: HRSA Program

## 2020-10-06 ENCOUNTER — Other Ambulatory Visit: Payer: Self-pay

## 2020-10-06 DIAGNOSIS — N179 Acute kidney failure, unspecified: Secondary | ICD-10-CM | POA: Diagnosis present

## 2020-10-06 DIAGNOSIS — Z23 Encounter for immunization: Secondary | ICD-10-CM | POA: Diagnosis not present

## 2020-10-06 DIAGNOSIS — U071 COVID-19: Secondary | ICD-10-CM | POA: Diagnosis present

## 2020-10-06 DIAGNOSIS — J1282 Pneumonia due to coronavirus disease 2019: Secondary | ICD-10-CM | POA: Diagnosis present

## 2020-10-06 DIAGNOSIS — J9601 Acute respiratory failure with hypoxia: Secondary | ICD-10-CM | POA: Diagnosis present

## 2020-10-06 LAB — LACTIC ACID, PLASMA
Lactic Acid, Venous: 0.9 mmol/L (ref 0.5–1.9)
Lactic Acid, Venous: 1.4 mmol/L (ref 0.5–1.9)

## 2020-10-06 LAB — LACTATE DEHYDROGENASE: LDH: 145 U/L (ref 98–192)

## 2020-10-06 LAB — FERRITIN: Ferritin: 552 ng/mL — ABNORMAL HIGH (ref 24–336)

## 2020-10-06 LAB — TROPONIN I (HIGH SENSITIVITY)
Troponin I (High Sensitivity): 4 ng/L (ref <18)
Troponin I (High Sensitivity): 3 ng/L (ref <18)

## 2020-10-06 LAB — TRIGLYCERIDES: Triglycerides: 163 mg/dL — ABNORMAL HIGH (ref <150)

## 2020-10-06 LAB — GLUCOSE, CAPILLARY
Glucose-Capillary: 151 mg/dL — ABNORMAL HIGH (ref 70–99)
Glucose-Capillary: 141 mg/dL — ABNORMAL HIGH (ref 70–99)

## 2020-10-06 LAB — FIBRINOGEN: Fibrinogen: 349 mg/dL (ref 210–475)

## 2020-10-06 LAB — RESP PANEL BY RT-PCR (FLU A&B, COVID) ARPGX2
Influenza A by PCR: NEGATIVE
Influenza B by PCR: NEGATIVE
SARS Coronavirus 2 by RT PCR: POSITIVE — AB

## 2020-10-06 LAB — BRAIN NATRIURETIC PEPTIDE: B Natriuretic Peptide: 8.9 pg/mL (ref 0.0–100.0)

## 2020-10-06 LAB — D-DIMER, QUANTITATIVE: D-Dimer, Quant: 0.49 ug/mL-FEU (ref 0.00–0.50)

## 2020-10-06 LAB — C-REACTIVE PROTEIN: CRP: 0.6 mg/dL (ref <1.0)

## 2020-10-06 LAB — PROCALCITONIN: Procalcitonin: <0.1 ng/mL

## 2020-10-06 LAB — CBG MONITORING, ED
Glucose-Capillary: 142 mg/dL — ABNORMAL HIGH (ref 70–99)
Glucose-Capillary: 126 mg/dL — ABNORMAL HIGH (ref 70–99)

## 2020-10-06 MED ORDER — ONDANSETRON HCL 4 MG/2ML IJ SOLN
4.0000 mg | Freq: Four times a day (QID) | INTRAMUSCULAR | Status: DC | PRN
  Filled 2020-10-06: qty 2

## 2020-10-06 MED ORDER — GUAIFENESIN-DM 100-10 MG/5ML PO SYRP
10.0000 mL | ORAL_SOLUTION | ORAL | Status: DC | PRN
  Administered 2020-10-06: 10 mL via ORAL
  Filled 2020-10-06 (×2): qty 10

## 2020-10-06 MED ORDER — SODIUM CHLORIDE 0.9 % IV SOLN
200.0000 mg | Freq: Once | INTRAVENOUS | Status: AC
  Administered 2020-10-06: 200 mg via INTRAVENOUS
  Filled 2020-10-06: qty 40

## 2020-10-06 MED ORDER — HYDROMORPHONE HCL 1 MG/ML IJ SOLN
1.0000 mg | Freq: Once | INTRAMUSCULAR | Status: AC
  Administered 2020-10-06: 1 mg via INTRAVENOUS
  Filled 2020-10-06: qty 1

## 2020-10-06 MED ORDER — ONDANSETRON HCL 4 MG PO TABS: 4.0000 mg | ORAL_TABLET | Freq: Four times a day (QID) | ORAL | Status: DC | PRN

## 2020-10-06 MED ORDER — ACETAMINOPHEN 325 MG PO TABS
650.0000 mg | ORAL_TABLET | Freq: Four times a day (QID) | ORAL | Status: DC | PRN
  Administered 2020-10-06 – 2020-10-07 (×2): 650 mg via ORAL
  Filled 2020-10-06 (×2): qty 2

## 2020-10-06 MED ORDER — SENNOSIDES-DOCUSATE SODIUM 8.6-50 MG PO TABS
2.0000 | ORAL_TABLET | Freq: Every day | ORAL | Status: DC
  Filled 2020-10-06: qty 2

## 2020-10-06 MED ORDER — INSULIN ASPART 100 UNIT/ML ~~LOC~~ SOLN
0.0000 [IU] | Freq: Three times a day (TID) | SUBCUTANEOUS | Status: DC
  Administered 2020-10-06: 3 [IU] via SUBCUTANEOUS
  Administered 2020-10-07: 2 [IU] via SUBCUTANEOUS

## 2020-10-06 MED ORDER — HYDROCOD POLST-CPM POLST ER 10-8 MG/5ML PO SUER: 5.0000 mL | Freq: Two times a day (BID) | ORAL | Status: DC | PRN

## 2020-10-06 MED ORDER — SODIUM CHLORIDE 0.9 % IV BOLUS
500.0000 mL | Freq: Once | INTRAVENOUS | Status: AC
  Administered 2020-10-06: 500 mL via INTRAVENOUS

## 2020-10-06 MED ORDER — DEXAMETHASONE SODIUM PHOSPHATE 10 MG/ML IJ SOLN
6.0000 mg | Freq: Once | INTRAMUSCULAR | Status: AC
  Administered 2020-10-06: 6 mg via INTRAVENOUS
  Filled 2020-10-06: qty 1

## 2020-10-06 MED ORDER — LACTATED RINGERS IV BOLUS
1000.0000 mL | Freq: Once | INTRAVENOUS | Status: AC
  Administered 2020-10-06: 1000 mL via INTRAVENOUS

## 2020-10-06 MED ORDER — IOHEXOL 350 MG/ML SOLN
100.0000 mL | Freq: Once | INTRAVENOUS | Status: AC | PRN
  Administered 2020-10-06: 100 mL via INTRAVENOUS

## 2020-10-06 MED ORDER — DOXYCYCLINE HYCLATE 100 MG PO TABS
100.0000 mg | ORAL_TABLET | Freq: Once | ORAL | Status: AC
  Administered 2020-10-06: 100 mg via ORAL
  Filled 2020-10-06: qty 1

## 2020-10-06 MED ORDER — ALUM & MAG HYDROXIDE-SIMETH 200-200-20 MG/5ML PO SUSP
30.0000 mL | Freq: Once | ORAL | Status: AC
  Administered 2020-10-06: 30 mL via ORAL
  Filled 2020-10-06: qty 30

## 2020-10-06 MED ORDER — SODIUM CHLORIDE 0.9 % IV SOLN
100.0000 mg | Freq: Every day | INTRAVENOUS | Status: DC
  Administered 2020-10-07: 100 mg via INTRAVENOUS
  Filled 2020-10-06: qty 20

## 2020-10-06 MED ORDER — ENOXAPARIN SODIUM 40 MG/0.4ML ~~LOC~~ SOLN
40.0000 mg | SUBCUTANEOUS | Status: DC
  Administered 2020-10-06 – 2020-10-07 (×2): 40 mg via SUBCUTANEOUS
  Filled 2020-10-06 (×2): qty 0.4

## 2020-10-06 MED ORDER — DICYCLOMINE HCL 10 MG PO CAPS
10.0000 mg | ORAL_CAPSULE | Freq: Once | ORAL | Status: AC
  Administered 2020-10-06: 10 mg via ORAL
  Filled 2020-10-06: qty 1

## 2020-10-06 MED ORDER — DEXAMETHASONE SODIUM PHOSPHATE 10 MG/ML IJ SOLN
6.0000 mg | INTRAMUSCULAR | Status: DC
  Administered 2020-10-07: 6 mg via INTRAVENOUS
  Filled 2020-10-06: qty 1

## 2020-10-06 NOTE — H&P (Addendum)
Date: 10/06/2020               Patient Name:  Roberto Bates. MRN: [id number]  DOB: 1975-12-08 Age / Sex: 44 y.o., male   PCP: Patient, No Pcp Per         Medical Service: Internal Medicine Teaching Service         Attending Physician: Dr. Mayford Knife, Dorene Ar, MD    First Contact: Dr. Eliezer Bottom Pager: [phone]            After Hours (After 5p/  First Contact Pager: [phone]  weekends / holidays): Second Contact Pager: [phone]   Chief Complaint: abdominal and chest pain  History of Present Illness: Roberto Bates is a 44 year old male with PMHx of prior provoked DVT presenting with two days of generalized abdominal pain/discomfort radiating to chest and back. He endorses no significant alleviating or aggravating factors. Also endorses associated fevers/chills, diarrhea (up to 8 bowel movements two days ago, and 3 bowel movements yesterday), and dry cough. He also has nausea but denies any vomiting. He denies any chest pain but does have dyspnea at rest and with walking. Patient is currently unvaccinated. He endorses being around possible sick contact on Thanksgiving day.   Meds:  Current Meds  Medication Sig  . ibuprofen (ADVIL,MOTRIN) 200 MG tablet Take 200 mg by mouth daily as needed for moderate pain.     Allergies: Allergies as of 10/05/2020 - Review Complete 10/05/2020  Allergen Reaction Noted  . No known allergies  09/07/2016   Past Medical History:  Diagnosis Date  . DVT (deep venous thrombosis) (HCC)    right leg     Family History:  Family History  Problem Relation Age of Onset  . Diabetes Mother   Mother - hepatic cancer  Social History:  Patient lives with his wife and nephew. He works as an Personnel officer. He denies any tobacco use or illicit drug use. Occasional alcohol use. Last drink was on Thanksgiving - 3 beers.   Review of Systems: A complete ROS was negative except as per HPI.   Physical Exam: Blood pressure (!) 134/92, pulse 61,  temperature 98.7 F (37.1 C), temperature source Oral, resp. rate (!) 31, height 5\' 10"  (1.778 m), weight 106.6 kg, SpO2 95 %. Physical Exam  Constitutional: Appears well-developed and well-nourished. No distress.  HENT: Normocephalic and atraumatic, EOMI,  dry mucus membranes Cardiovascular: Normal rate, regular rhythm, S1 and S2 present, no murmurs, rubs, gallops.  Distal pulses intact Respiratory: No respiratory distress, no accessory muscle use. Bibasilar crackles on exam; tachypneic on 3L O2 via   GI: Nondistended, soft, diffuse tenderness to palpation, bowel sounds present Musculoskeletal: Normal bulk and tone.  No peripheral edema noted. Neurological: Is alert and oriented x4, no apparent focal deficits noted. Skin: Warm and dry.  No rash, erythema, lesions noted.  CBC    Component Value Date/Time   WBC 4.5 10/05/2020 2048   RBC 6.63 (H) 10/05/2020 2048   HGB 17.9 (H) 10/05/2020 2048   HCT 54.1 (H) 10/05/2020 2048   PLT 164 10/05/2020 2048   MCV 81.6 10/05/2020 2048   MCH 27.0 10/05/2020 2048   MCHC 33.1 10/05/2020 2048   RDW 12.2 10/05/2020 2048   LYMPHSABS 2.1 12/11/2018 1620   MONOABS 1.0 12/11/2018 1620   EOSABS 0.2 12/11/2018 1620   BASOSABS 0.1 12/11/2018 1620   CMP     Component Value Date/Time   NA 137 10/05/2020 2048  K 3.7 10/05/2020 2048   CL 105 10/05/2020 2048   CO2 21 (L) 10/05/2020 2048   GLUCOSE 98 10/05/2020 2048   BUN 15 10/05/2020 2048   CREATININE 1.40 (H) 10/05/2020 2048   CALCIUM 8.8 (L) 10/05/2020 2048   PROT 7.5 10/05/2020 2048   ALBUMIN 4.2 10/05/2020 2048   AST 26 10/05/2020 2048   ALT 41 10/05/2020 2048   ALKPHOS 66 10/05/2020 2048   BILITOT 0.9 10/05/2020 2048   GFRNONAA >60 10/05/2020 2048   GFRAA >60 12/11/2018 1620   COVID-19 Labs  Recent Labs    10/06/20 0653  DDIMER 0.49  FERRITIN 552*  LDH 145  CRP 0.6    Lab Results  Component Value Date   SARSCOV2NAA POSITIVE (A) 10/06/2020    EKG: personally reviewed my  interpretation is sinus tachycardia with T wave inversions in leads II, III, aVF  CXR: personally reviewed my interpretation is bibasilar atelectasis. No pleural effusions or opacities noted.  CTA CHEST/ABDOMEN/PELVIS W CONTRAST:  IMPRESSION: 1. No evidence of significant pulmonary embolus. 2. Bilateral perihilar and basilar airspace infiltrates may represent edema or multifocal pneumonia. Aspiration would be another possibility. 3. No acute process demonstrated in the abdomen or pelvis. 4. Mild diffuse fatty infiltration of the liver.  Assessment & Plan by Problem: Active Problems:   Pneumonia due to COVID-19 virus Roberto Roberto Bates is a 44 year old male with PMHx of provoked DVT not on longterm anticoagulation admitted for COVID-19 pneumonia.  COVID 19 pneumonia: Patient with two days of abdominal pain, nausea, diarrhea, dyspnea presented for further evaluation of his symptoms today. He is vaccinated with positive sick contacts. Due to concerns for PE, CTA chest obtained which was significant for multifocal pneumonia. COVID positive. Patient started on Remdesivir and Dexamethasone in the ED. Elevated Ferritin but D-dimer and CRP wnl. He is currently requiring 3L via [place].  - Continue Remdesivir  - Continue dexamethasone - Will hold off on baricitinib at this time given that he is only requiring 3L and CRP wnl - Oxygen supplementation to maintain SpO2 >88% - Encourage early ambulation, proning and ICS/flutter valve  Elevated creatinine: sCr elevated to 1.4 (baseline 1.33 in 12/2018). Patient does not have known history of CKD at this time. Suspect his current elevation in sCr may be in setting of decreased oral intake and increased GI losses.  - 1L LR bolus - F/u renal function in AM  Diet: Regular Fluids: LR  Electrolytes: Monitor and replete prn DVT Prophylaxis: Lovenox Code status: FULL    Dispo: Admit patient to Inpatient with expected length of stay greater than 2  midnights.  Signed: Eliezer Bottom, MD  [facility] PGY-2 10/06/2020, 9:50 AM  Pager: [phone] After 5pm on weekdays and 1pm on weekends: On Call pager: [phone]

## 2020-10-06 NOTE — ED Notes (Signed)
Placed pt on 2L [place] due to O2 sat under 95%

## 2020-10-06 NOTE — Progress Notes (Signed)
Patient transferred from [facility] to 5W11 via bed on 3L [place]; alert and oriented x 4; complaining of pain 7/10 in abdomen; IV saline locked in LAC ; skin intact.   Oriented patient to room and unit; gave pt incentive spirometer and flutter valve; instructed how to use the call bell and fall risk precautions. Will continue to monitor the patient.

## 2020-10-06 NOTE — ED Notes (Signed)
This NT ambulated pt while checking pulse oximetry. Pt saturation sitting was at 94%, once standing, pt saturation dropped to 92% and remained at 92% steadily while ambulating. Pt respirations increased while walking and pt stated he felt short of breath while ambulating.

## 2020-10-06 NOTE — ED Notes (Signed)
Transport to CT

## 2020-10-06 NOTE — ED Notes (Signed)
Once pt returned to bed and was resting, oxygen saturation started dropping to upper 80's. Pt placed back on 2.5L of oxygen and now saturating at 93/94%.

## 2020-10-07 DIAGNOSIS — U071 COVID-19: Principal | ICD-10-CM

## 2020-10-07 LAB — COMPREHENSIVE METABOLIC PANEL
ALT: 33 U/L (ref 0–44)
AST: 20 U/L (ref 15–41)
Albumin: 3.5 g/dL (ref 3.5–5.0)
Alkaline Phosphatase: 53 U/L (ref 38–126)
Anion gap: 9 (ref 5–15)
BUN: 17 mg/dL (ref 6–20)
CO2: 24 mmol/L (ref 22–32)
Calcium: 8.7 mg/dL — ABNORMAL LOW (ref 8.9–10.3)
Chloride: 105 mmol/L (ref 98–111)
Creatinine, Ser: 1.15 mg/dL (ref 0.61–1.24)
GFR, Estimated: >60 mL/min (ref >60)
Glucose, Bld: 108 mg/dL — ABNORMAL HIGH (ref 70–99)
Potassium: 3.9 mmol/L (ref 3.5–5.1)
Sodium: 138 mmol/L (ref 135–145)
Total Bilirubin: 0.9 mg/dL (ref 0.3–1.2)
Total Protein: 6.5 g/dL (ref 6.5–8.1)

## 2020-10-07 LAB — CBC WITH DIFFERENTIAL/PLATELET
Abs Immature Granulocytes: 0 10*3/uL (ref 0.00–0.07)
Basophils Absolute: 0 10*3/uL (ref 0.0–0.1)
Basophils Relative: 0 %
Eosinophils Absolute: 0 10*3/uL (ref 0.0–0.5)
Eosinophils Relative: 0 %
HCT: 48.2 % (ref 39.0–52.0)
Hemoglobin: 16.1 g/dL (ref 13.0–17.0)
Immature Granulocytes: 0 %
Lymphocytes Relative: 42 %
Lymphs Abs: 2 10*3/uL (ref 0.7–4.0)
MCH: 27 pg (ref 26.0–34.0)
MCHC: 33.4 g/dL (ref 30.0–36.0)
MCV: 80.7 fL (ref 80.0–100.0)
Monocytes Absolute: 0.5 10*3/uL (ref 0.1–1.0)
Monocytes Relative: 11 %
Neutro Abs: 2.3 10*3/uL (ref 1.7–7.7)
Neutrophils Relative %: 47 %
Platelets: 147 10*3/uL — ABNORMAL LOW (ref 150–400)
RBC: 5.97 MIL/uL — ABNORMAL HIGH (ref 4.22–5.81)
RDW: 12.1 % (ref 11.5–15.5)
WBC: 4.8 10*3/uL (ref 4.0–10.5)
nRBC: 0 % (ref 0.0–0.2)

## 2020-10-07 LAB — GLUCOSE, CAPILLARY
Glucose-Capillary: 89 mg/dL (ref 70–99)
Glucose-Capillary: 132 mg/dL — ABNORMAL HIGH (ref 70–99)

## 2020-10-07 LAB — MAGNESIUM: Magnesium: 2.1 mg/dL (ref 1.7–2.4)

## 2020-10-07 LAB — C-REACTIVE PROTEIN: CRP: 0.8 mg/dL (ref <1.0)

## 2020-10-07 LAB — FERRITIN: Ferritin: 576 ng/mL — ABNORMAL HIGH (ref 24–336)

## 2020-10-07 LAB — HIV ANTIBODY (ROUTINE TESTING W REFLEX): HIV Screen 4th Generation wRfx: NONREACTIVE

## 2020-10-07 LAB — D-DIMER, QUANTITATIVE: D-Dimer, Quant: 0.36 ug/mL-FEU (ref 0.00–0.50)

## 2020-10-07 MED ORDER — ACETAMINOPHEN 325 MG PO TABS: 650.0000 mg | ORAL_TABLET | Freq: Four times a day (QID) | ORAL | 0 refills | Status: DC | PRN

## 2020-10-07 MED ORDER — INFLUENZA VAC SPLIT QUAD 0.5 ML IM SUSY
0.5000 mL | PREFILLED_SYRINGE | Freq: Once | INTRAMUSCULAR | Status: AC
  Administered 2020-10-07: 0.5 mL via INTRAMUSCULAR
  Filled 2020-10-07: qty 0.5

## 2020-10-07 NOTE — Discharge Instructions (Signed)
You are scheduled for an outpatient Remdesivir infusion at 12pm on Tuesday 11/30, Wednesday 12/1, and Thursday 12/2 at [facility]. Please park at [address] [place], [place], as staff will be escorting you through the east entrance of the hospital. Appointments take approximately 45 minutes.    The address for the infusion clinic site is:  --GPS address is [address] - the parking is located near [employer] building where you will see  COVID19 Infusion feather banner marking the entrance to parking.   (see photos below)            --Enter into the 2nd entrance where the "wave, flag banner" is at the road. Turn into this 2nd entrance and immediately turn left to park in 1 of the 5 parking spots.   --Please stay in your car and call the desk for assistance inside [phone].   The day of your visit you should: Marland Kitchen Get plenty of rest the night before and drink plenty of water . Eat a light meal/snack before coming and take your medications as prescribed  . Wear warm, comfortable clothes with a shirt that can roll-up over the elbow (will need IV start).  . Wear a mask  . Consider bringing some activity to help pass the time

## 2020-10-07 NOTE — Progress Notes (Signed)
Patient scheduled for outpatient Remdesivir infusions at 12pm on Tuesday 11/30, Wednesday 12/1, and Thursday 12/2 at [facility]. Please inform the patient to park at [address] [place], [place], as staff will be escorting the patient through the east entrance of the hospital. Appointments take approximately 45 minutes.    There is a wave flag banner located near the entrance on [address]. Turn into this entrance and immediately turn left or right and park in 1 of the 10 designated Covid Infusion Parking spots. There is a phone number on the sign, please call and let the staff know what spot you are in and we will come out and get you. For questions call [phone].  Thanks.

## 2020-10-07 NOTE — Progress Notes (Signed)
SATURATION QUALIFICATIONS: (This note is used to comply with regulatory documentation for home oxygen)  Patient Saturations on Room Air at Rest = 94%  Patient Saturations on Room Air while Ambulating = 91%   

## 2020-10-07 NOTE — Progress Notes (Signed)
Subjective: HD 1 Overnight, no acute events reported.  Mr Roberto Bates was evaluated at bedside this morning. He is sitting at the edge of the bed, on room air and in no acute distress. Patient endorses improved dyspnea and feels like he has overall improved from presentation. He was able to walk in the hallway without worsening dyspnea. His abdominal pain has resolved and he was able to tolerate breakfast this morning.  Discussed that he is stable for discharge today and can complete infusions in the outpatient setting. Recommended for 21 day quarantine for which patient is agreeable but does endorse concerns regarding possible financial difficulties.   Objective: Vital signs in last 24 hours: Vitals:   10/06/20 2329 10/07/20 0349 10/07/20 0718 10/07/20 1153  BP: 124/81 119/83 130/72 120/73  Pulse: 61 75 73 86  Resp: 18 (!) 24 18 (!) 26  Temp: 98.1 F (36.7 C) 98.3 F (36.8 C) 98.1 F (36.7 C) 98.7 F (37.1 C)  TempSrc: Axillary Axillary Oral Oral  SpO2: 93% 92% 94% 93%  Weight:      Height:       CBC Latest Ref Rng & Units 10/07/2020 10/05/2020 12/11/2018  WBC 4.0 - 10.5 K/uL 4.8 4.5 10.3  Hemoglobin 13.0 - 17.0 g/dL 08.6 17.9(H) 16.9  Hematocrit 39 - 52 % 48.2 54.1(H) 50.3  Platelets 150 - 400 K/uL 147(L) 164 208   BMP Latest Ref Rng & Units 10/07/2020 10/05/2020 12/11/2018  Glucose 70 - 99 mg/dL 578(I) 98 696(E)  BUN 6 - 20 mg/dL 17 15 14   Creatinine 0.61 - 1.24 mg/dL 9.52) 8.41(L)  Sodium 135 - 145 mmol/L 138 137 138  Potassium 3.5 - 5.1 mmol/L 3.9 3.7 4.0  Chloride 98 - 111 mmol/L 105 105 108  CO2 22 - 32 mmol/L 24 21(L) 20(L)  Calcium 8.9 - 10.3 mg/dL 2.44(W) 1.0(U) 7.2(Z)   COVID-19 Labs  Recent Labs    10/06/20 0653 10/07/20 0315  DDIMER 0.49 0.36  FERRITIN 552* 576*  LDH 145  --   CRP 0.6 0.8   Physical Exam  Constitutional: Appears well-developed and well-nourished. No distress.  Cardiovascular: Normal rate, regular rhythm, S1 and S2 present,  no murmurs, rubs, gallops.  Distal pulses intact Respiratory: No respiratory distress, no accessory muscle use. Bibasilar crackles; on room air  GI: Nondistended, soft, nontender to palpation, normal active bowel sounds Neurological: Is alert and oriented x4, no apparent focal deficits noted. Skin: Warm and dry.  No rash, erythema, lesions noted.  Assessment/Plan:  Active Problems:   Pneumonia due to COVID-19 virus Mr Jamarl Pew is a 44 year old male with PMHx of provoked DVT not on longterm anticoagulation admitted for COVID-19 pneumonia.  COVID 19 Pneumonia:  Patient endorses symptomatic resolution this morning. He is back on room air and was able to maintain his SpO2 >90% with ambulation. He endorses improved appetite this morning. Patient has received two doses of remdesivir and dexamethasone. At this time, he is stable for discharge to home with follow up in infusion clinic to complete five days of treatment.  - Discharge to home with remdesivir infusion on Tues/Wed/Thurs at 12PM at infusion center - Recommended 21 day quarantine period and to get vaccine after 3 months  - Return precautions discussed  - Early ambulation, ICS/ flutter valve   Elevated sCr: Initially noted to have sCr to which has improved to 1.15 this morning with fluid resuscitation. Suspect his initial elevated sCr was elevated in setting of decreased  oral intake. Patient is currently tolerating oral intake well.  - F/u BMP with PCP   Diet: Regular Fluids/Electrolytes: N/A DVT Prophylaxis: Lovenox Code status: FULL   Prior to Admission Living Arrangement: Home Anticipated Discharge Location: Home  Barriers to Discharge: None  Dispo: Anticipated discharge in approximately today.   Eliezer Bottom, MD  [facility] PGY-2 10/07/2020, 11:57 AM Pager: [phone] After 5pm on weekdays and 1pm on weekends: On Call pager [phone]

## 2020-10-08 ENCOUNTER — Ambulatory Visit (HOSPITAL_COMMUNITY)
Admit: 2020-10-08 | Discharge: 2020-10-08 | Disposition: A | Discharge status: Home / Self Care | Payer: HRSA Program | Source: Ambulatory Visit | Attending: Pulmonary Disease | Admitting: Pulmonary Disease

## 2020-10-08 DIAGNOSIS — U071 COVID-19: Secondary | ICD-10-CM | POA: Insufficient documentation

## 2020-10-08 DIAGNOSIS — J1282 Pneumonia due to coronavirus disease 2019: Secondary | ICD-10-CM | POA: Diagnosis not present

## 2020-10-08 MED ORDER — EPINEPHRINE 0.3 MG/0.3ML IJ SOAJ: 0.3000 mg | Freq: Once | INTRAMUSCULAR | Status: DC | PRN

## 2020-10-08 MED ORDER — METHYLPREDNISOLONE SODIUM SUCC 125 MG IJ SOLR: 125.0000 mg | Freq: Once | INTRAMUSCULAR | Status: DC | PRN

## 2020-10-08 MED ORDER — SODIUM CHLORIDE 0.9 % IV SOLN: INTRAVENOUS | Status: DC | PRN

## 2020-10-08 MED ORDER — DIPHENHYDRAMINE HCL 50 MG/ML IJ SOLN: 50.0000 mg | Freq: Once | INTRAMUSCULAR | Status: DC | PRN

## 2020-10-08 MED ORDER — SODIUM CHLORIDE 0.9 % IV SOLN
100.0000 mg | Freq: Once | INTRAVENOUS | Status: AC
  Administered 2020-10-08: 100 mg via INTRAVENOUS

## 2020-10-08 MED ORDER — FAMOTIDINE IN NACL 20-0.9 MG/50ML-% IV SOLN: 20.0000 mg | Freq: Once | INTRAVENOUS | Status: DC | PRN

## 2020-10-08 MED ORDER — ALBUTEROL SULFATE HFA 108 (90 BASE) MCG/ACT IN AERS: 2.0000 | INHALATION_SPRAY | Freq: Once | RESPIRATORY_TRACT | Status: DC | PRN

## 2020-10-08 NOTE — Progress Notes (Signed)
  Diagnosis: COVID-19  Physician:Dr Delford Field   Procedure: Covid Infusion Clinic Med: remdesivir infusion - Provided patient with remdesivir fact sheet for patients, parents and caregivers prior to infusion.  Complications: No immediate complications noted.  Discharge: Discharged home   Roberto Bates 10/08/2020

## 2020-10-09 ENCOUNTER — Ambulatory Visit (HOSPITAL_COMMUNITY)
Admit: 2020-10-09 | Discharge: 2020-10-09 | Disposition: A | Discharge status: Home / Self Care | Payer: HRSA Program | Source: Ambulatory Visit | Attending: Pulmonary Disease | Admitting: Pulmonary Disease

## 2020-10-09 DIAGNOSIS — J1282 Pneumonia due to coronavirus disease 2019: Secondary | ICD-10-CM | POA: Diagnosis not present

## 2020-10-09 DIAGNOSIS — U071 COVID-19: Secondary | ICD-10-CM | POA: Insufficient documentation

## 2020-10-09 MED ORDER — SODIUM CHLORIDE 0.9 % IV SOLN
100.0000 mg | Freq: Once | INTRAVENOUS | Status: AC
  Administered 2020-10-09: 100 mg via INTRAVENOUS

## 2020-10-09 MED ORDER — METHYLPREDNISOLONE SODIUM SUCC 125 MG IJ SOLR: 125.0000 mg | Freq: Once | INTRAMUSCULAR | Status: DC | PRN

## 2020-10-09 MED ORDER — DIPHENHYDRAMINE HCL 50 MG/ML IJ SOLN: 50.0000 mg | Freq: Once | INTRAMUSCULAR | Status: DC | PRN

## 2020-10-09 MED ORDER — ALBUTEROL SULFATE HFA 108 (90 BASE) MCG/ACT IN AERS: 2.0000 | INHALATION_SPRAY | Freq: Once | RESPIRATORY_TRACT | Status: DC | PRN

## 2020-10-09 MED ORDER — SODIUM CHLORIDE 0.9 % IV SOLN: INTRAVENOUS | Status: DC | PRN

## 2020-10-09 MED ORDER — EPINEPHRINE 0.3 MG/0.3ML IJ SOAJ: 0.3000 mg | Freq: Once | INTRAMUSCULAR | Status: DC | PRN

## 2020-10-09 MED ORDER — FAMOTIDINE IN NACL 20-0.9 MG/50ML-% IV SOLN: 20.0000 mg | Freq: Once | INTRAVENOUS | Status: DC | PRN

## 2020-10-09 NOTE — Progress Notes (Signed)
Diagnosis: COVID-19  Physician: Dr. Shan Levans  Procedure: Covid Infusion Clinic Med: Remdesivir infusion #4/5 - Provided patient with sotrovimab fact sheet for patients, parents, and caregivers prior to infusion.   Complications: No immediate complications noted; however, pt tachypneic at end of treatment, denies feeling short of breath; pt stating he feels worried and sometimes breathing more at home when worried at home. States he believes it's more of a "mental thing". Provided pt with D/C papers emphasizing number to call APP, if needed; pt indicates understanding.  Pt declines wheel chair.  Discharge: Discharged home

## 2020-10-09 NOTE — Discharge Instructions (Signed)
10 Things You Can Do to Manage Your COVID-19 Symptoms at Home If you have possible or confirmed COVID-19: 1. Stay home from work and school. And stay away from other public places. If you must go out, avoid using any kind of public transportation, ridesharing, or taxis. 2. Monitor your symptoms carefully. If your symptoms get worse, call your healthcare provider immediately. 3. Get rest and stay hydrated. 4. If you have a medical appointment, call the healthcare provider ahead of time and tell them that you have or may have COVID-19. 5. For medical emergencies, call 911 and notify the dispatch personnel that you have or may have COVID-19. 6. Cover your cough and sneezes with a tissue or use the inside of your elbow. 7. Wash your hands often with soap and water for at least 20 seconds or clean your hands with an alcohol-based hand sanitizer that contains at least 60% alcohol. 8. As much as possible, stay in a specific room and away from other people in your home. Also, you should use a separate bathroom, if available. If you need to be around other people in or outside of the home, wear a mask. 9. Avoid sharing personal items with other people in your household, like dishes, towels, and bedding. 10. Clean all surfaces that are touched often, like counters, tabletops, and doorknobs. Use household cleaning sprays or wipes according to the label instructions. cdc.gov/coronavirus 05/10/2019 This information is not intended to replace advice given to you by your health care provider. Make sure you discuss any questions you have with your health care provider. Document Revised: 10/12/2019 Document Reviewed: 10/12/2019 Elsevier Patient Education  2020 Elsevier Inc.  If you have any questions or concerns after the infusion please call the Advanced Practice Provider on call at [phone]. This number is ONLY intended for your use regarding questions or concerns about the infusion post-treatment  side-effects.  Please do not provide this number to others for use. For return to work notes please contact your primary care provider.   If someone you know is interested in receiving treatment please have them call the COVID hotline at [phone].    

## 2020-10-10 ENCOUNTER — Ambulatory Visit (HOSPITAL_COMMUNITY)
Admit: 2020-10-10 | Discharge: 2020-10-10 | Disposition: A | Discharge status: Home / Self Care | Payer: HRSA Program | Attending: Pulmonary Disease | Admitting: Pulmonary Disease

## 2020-10-10 DIAGNOSIS — U071 COVID-19: Secondary | ICD-10-CM | POA: Diagnosis not present

## 2020-10-10 MED ORDER — METHYLPREDNISOLONE SODIUM SUCC 125 MG IJ SOLR: 125.0000 mg | Freq: Once | INTRAMUSCULAR | Status: DC | PRN

## 2020-10-10 MED ORDER — FAMOTIDINE IN NACL 20-0.9 MG/50ML-% IV SOLN: 20.0000 mg | Freq: Once | INTRAVENOUS | Status: DC | PRN

## 2020-10-10 MED ORDER — EPINEPHRINE 0.3 MG/0.3ML IJ SOAJ: 0.3000 mg | Freq: Once | INTRAMUSCULAR | Status: DC | PRN

## 2020-10-10 MED ORDER — SODIUM CHLORIDE 0.9 % IV SOLN: INTRAVENOUS | Status: DC | PRN

## 2020-10-10 MED ORDER — DIPHENHYDRAMINE HCL 50 MG/ML IJ SOLN: 50.0000 mg | Freq: Once | INTRAMUSCULAR | Status: DC | PRN

## 2020-10-10 MED ORDER — ALBUTEROL SULFATE HFA 108 (90 BASE) MCG/ACT IN AERS: 2.0000 | INHALATION_SPRAY | Freq: Once | RESPIRATORY_TRACT | Status: DC | PRN

## 2020-10-10 MED ORDER — SODIUM CHLORIDE 0.9 % IV SOLN
100.0000 mg | Freq: Once | INTRAVENOUS | Status: AC
  Administered 2020-10-10: 100 mg via INTRAVENOUS
  Filled 2020-10-10: qty 20

## 2020-10-10 NOTE — Discharge Instructions (Signed)
10 Things You Can Do to Manage Your COVID-19 Symptoms at Home If you have possible or confirmed COVID-19: 1. Stay home from work and school. And stay away from other public places. If you must go out, avoid using any kind of public transportation, ridesharing, or taxis. 2. Monitor your symptoms carefully. If your symptoms get worse, call your healthcare provider immediately. 3. Get rest and stay hydrated. 4. If you have a medical appointment, call the healthcare provider ahead of time and tell them that you have or may have COVID-19. 5. For medical emergencies, call 911 and notify the dispatch personnel that you have or may have COVID-19. 6. Cover your cough and sneezes with a tissue or use the inside of your elbow. 7. Wash your hands often with soap and water for at least 20 seconds or clean your hands with an alcohol-based hand sanitizer that contains at least 60% alcohol. 8. As much as possible, stay in a specific room and away from other people in your home. Also, you should use a separate bathroom, if available. If you need to be around other people in or outside of the home, wear a mask. 9. Avoid sharing personal items with other people in your household, like dishes, towels, and bedding. 10. Clean all surfaces that are touched often, like counters, tabletops, and doorknobs. Use household cleaning sprays or wipes according to the label instructions. cdc.gov/coronavirus 05/10/2019 This information is not intended to replace advice given to you by your health care provider. Make sure you discuss any questions you have with your health care provider. Document Revised: 10/12/2019 Document Reviewed: 10/12/2019 Elsevier Patient Education  2020 Elsevier Inc. What types of side effects do monoclonal antibody drugs cause?  Common side effects  In general, the more common side effects caused by monoclonal antibody drugs include: . Allergic reactions, such as hives or itching . Flu-like signs and  symptoms, including chills, fatigue, fever, and muscle aches and pains . Nausea, vomiting . Diarrhea . Skin rashes . Low blood pressure   The CDC is recommending patients who receive monoclonal antibody treatments wait at least 90 days before being vaccinated.  Currently, there are no data on the safety and efficacy of mRNA COVID-19 vaccines in persons who received monoclonal antibodies or convalescent plasma as part of COVID-19 treatment. Based on the estimated half-life of such therapies as well as evidence suggesting that reinfection is uncommon in the 90 days after initial infection, vaccination should be deferred for at least 90 days, as a precautionary measure until additional information becomes available, to avoid interference of the antibody treatment with vaccine-induced immune responses. If you have any questions or concerns after the infusion please call the Advanced Practice Provider on call at [phone]. This number is ONLY intended for your use regarding questions or concerns about the infusion post-treatment side-effects.  Please do not provide this number to others for use. For return to work notes please contact your primary care provider.   If someone you know is interested in receiving treatment please have them call the COVID hotline at [phone].   

## 2020-10-10 NOTE — Progress Notes (Signed)
  Diagnosis: COVID-19  Physician: Shan Levans  Procedure: Final Covid Infusion Clinic Med: remdesivir infusion - Provided patient with remdesivir fact sheet for patients, parents and caregivers prior to infusion.  Complications: No immediate complications noted.  Discharge: Discharged home   Roberto Bates 10/10/2020

## 2020-10-11 LAB — CULTURE, BLOOD (ROUTINE X 2)
Culture: NO GROWTH
Culture: NO GROWTH
Special Requests: ADEQUATE

## 2020-11-20 NOTE — Discharge Summary (Signed)
Name: Roberto Bates. MRN: [id number] DOB: December 21, 1975 45 y.o. PCP: Patient, No Pcp Per  Date of Admission: 10/05/2020  8:34 PM Date of Discharge: 10/07/2020 Attending Physician: No att. providers found  Discharge Diagnosis: 1. COVID-19 Pneumonia 2. Acute kidney injury  Discharge Medications: Allergies as of 10/07/2020   No Known Allergies     Medication List    STOP taking these medications   Cholecalciferol 100 MCG (4000 UT) Tabs   docusate sodium 100 MG capsule Commonly known as: COLACE   HYDROcodone-acetaminophen 7.5-325 MG tablet Commonly known as: NORCO     TAKE these medications   acetaminophen 325 MG tablet Commonly known as: TYLENOL Take 2 tablets (650 mg total) by mouth every 6 (six) hours as needed for mild pain, fever or headache.   ibuprofen 200 MG tablet Commonly known as: ADVIL Take 200 mg by mouth daily as needed for moderate pain.       Disposition and follow-up:   Roberto.Roberto Bates. was discharged from [facility] in Stable condition.  At the hospital follow up visit please address:  1.  COVID-19 Pneumonia: Patient to receive remdesivir infusions x3 at infusion center; recommend quarantine at home and vaccination in 3 months; encouraged early ambulation and ICS/flutter valve  Acute kidney injury: sCr 1.15 on discharge. Will need f/u BMP   2.  Labs / imaging needed at time of follow-up: BMP  3.  Pending labs/ test needing follow-up: None  Hospital Course by problem list: 1. COVID 19 Pneumonia Patient presented with two days of abdominal pain, nausea, diarrhea, dyspnea in setting of positive sick contacts. Pateint was found to be COVID positive. Given history of prior provoked DVT not on long term anticoagulation, CTA Chest obtained which was significant for multifocal pneumonia. Patient started on Remdesivir and Dexamethasone given hypoxia, requiring 3L O2 supplementation. He did have an elevated ferritin but D-dimer and  CRP were wnl. Patient had significant improvement with redemsivir and dexamethasone and his oxygen requirement improved to room air by second day of admission. Patient discharged to home with outpatient remdesivir therapy for three days. He was encouraged to quarantine at home and encouraged for vaccination 3 months following infection.     2. Acute kidney injury Patient presented with elevated serum creatinine to 1.4 on admission (1.33 in 12/2018) without history of known CKD. Elevated sCr was suspected to be in setting of decreased oral intake and increased GI losses in setting of abdominal pain, nausea, and diarrhea. Patient noted to have improvement of sCr to 1.15 with fluid resuscitation. Recommend to maintain oral hydration. Recommend f/u BMP at PCP visit.   Discharge Vitals:   BP 134/78 (BP Location: Right Arm)   Pulse 81   Temp 98.7 F (37.1 C) (Oral)   Resp 17   Ht 5\' 10"  (1.778 m)   Wt 106.6 kg   SpO2 91%   BMI 33.72 kg/m   Pertinent Labs, Studies, and Procedures:  CBC Latest Ref Rng & Units 10/07/2020 10/05/2020 12/11/2018  WBC 4.0 - 10.5 K/uL 4.8 4.5 10.3  Hemoglobin 13.0 - 17.0 g/dL 02/09/2019 17.9(H) 16.9  Hematocrit 39.0 - 52.0 % 48.2 54.1(H) 50.3  Platelets 150 - 400 K/uL 147(L) 164 208   CMP Latest Ref Rng & Units 10/07/2020 10/05/2020 12/11/2018  Glucose 70 - 99 mg/dL 02/09/2019) 98 237(S)  BUN 6 - 20 mg/dL 17 15 14   Creatinine 0.61 - 1.24 mg/dL 283(T ) 5.17)  Sodium 135 - 145 mmol/L 138 137 138  Potassium 3.5 - 5.1 mmol/L 3.9 3.7 4.0  Chloride 98 - 111 mmol/L 105 105 108  CO2 22 - 32 mmol/L 24 21(L) 20(L)  Calcium 8.9 - 10.3 mg/dL 6.7(T) 2.4(P) 8.0(D)  Total Protein 6.5 - 8.1 g/dL 6.5 7.5 6.7  Total Bilirubin 0.3 - 1.2 mg/dL 0.9 0.9 1.0  Alkaline Phos 38 - 126 U/L 53 66 56  AST 15 - 41 U/L 20 26 35  ALT 0 - 44 U/L 33 41 49(H)   COVID-19 Labs  Lab Results  Component Value Date   SARSCOV2NAA POSITIVE (A) 10/06/2020   D-Dimer, Quant 0.00 - 0.50 ug/mL-FEU 0.36  0.49     CRP <1.0 mg/dL 0.8  0.6 CM  <9.8    Ferritin 24 - 336 ng/mL 576High  552High CM    Specimen Description BLOOD LEFT FOREARM   Special Requests BOTTLES DRAWN AEROBIC AND ANAEROBIC Blood Culture adequate volume   Culture NO GROWTH 5 DAYS  Performed at [facility], [address] [place], [place] [address]   Report Status 10/11/2020 FINAL    CXR 10/06/2020: IMPRESSION: Shallow inspiration with probable atelectasis in the lung bases. Cardiac enlargement.  CTA CHEST/ABD/PELVIS 10/06/2020: IMPRESSION: 1. No evidence of significant pulmonary embolus. 2. Bilateral perihilar and basilar airspace infiltrates may represent edema or multifocal pneumonia. Aspiration would be another possibility. 3. No acute process demonstrated in the abdomen or pelvis. 4. Mild diffuse fatty infiltration of the liver.  Discharge Instructions: Discharge Instructions    Call MD for:  difficulty breathing, headache or visual disturbances   Complete by: As directed    Call MD for:  extreme fatigue   Complete by: As directed    Call MD for:  persistant dizziness or light-headedness   Complete by: As directed    Call MD for:  persistant nausea and vomiting   Complete by: As directed    Call MD for:  temperature >100.4   Complete by: As directed    Diet - low sodium heart healthy   Complete by: As directed    Discharge instructions   Complete by: As directed    Roberto Bates, Roberto Bates were admitted with COVID pneumonia and improved with remdesivir and steroid treatment. On discharge, please continue Remdesivir treatments for 3 more days. You may go to the infusion center at 12pm on Tuesday 11/30, Wednesday 12/1, and Thursday 12/2 at [facility].  Please  park [address] [place], as staff will be escorting you through the east entrance of the hospital. There is a wave flag banner located near the entrance on [address]. Turn into this entranceand immediatelyturn left or right  and park in 1 of the 10 designated Covid Infusion Parking spots. There is a phone number on the sign, please call and let the staff know what spot you are in and we will come out and get you. For questions call [phone]. Appointments take approximately 45 minutes.   As we discussed, please quarantine for total of 21 days from onset of symptoms. Please get your COVID vaccine after 3 months. If you have any worsening of your symptoms, please seek emergent medical attention.   Increase activity slowly   Complete by: As directed       Signed: Eliezer Bottom, MD  [facility] PGY-2 11/20/2020, 11:54 AM   Pager: [phone]

## 2021-11-10 ENCOUNTER — Other Ambulatory Visit: Payer: Self-pay

## 2021-11-10 ENCOUNTER — Emergency Department (HOSPITAL_COMMUNITY): Payer: Self-pay

## 2021-11-10 ENCOUNTER — Encounter (HOSPITAL_COMMUNITY): Payer: Self-pay

## 2021-11-10 ENCOUNTER — Emergency Department (HOSPITAL_COMMUNITY)
Admission: EM | Admit: 2021-11-10 | Discharge: 2021-11-10 | Disposition: A | Discharge status: Home / Self Care | Payer: Self-pay | Attending: Emergency Medicine | Admitting: Emergency Medicine

## 2021-11-10 DIAGNOSIS — R0602 Shortness of breath: Secondary | ICD-10-CM | POA: Insufficient documentation

## 2021-11-10 DIAGNOSIS — Z5321 Procedure and treatment not carried out due to patient leaving prior to being seen by health care provider: Secondary | ICD-10-CM | POA: Insufficient documentation

## 2021-11-10 DIAGNOSIS — R079 Chest pain, unspecified: Secondary | ICD-10-CM | POA: Insufficient documentation

## 2021-11-10 LAB — TROPONIN I (HIGH SENSITIVITY): Troponin I (High Sensitivity): 4 ng/L (ref <18)

## 2021-11-10 LAB — CBC
HCT: 51.8 % (ref 39.0–52.0)
Hemoglobin: 17.5 g/dL — ABNORMAL HIGH (ref 13.0–17.0)
MCH: 27.4 pg (ref 26.0–34.0)
MCHC: 33.8 g/dL (ref 30.0–36.0)
MCV: 81.1 fL (ref 80.0–100.0)
Platelets: 198 10*3/uL (ref 150–400)
RBC: 6.39 MIL/uL — ABNORMAL HIGH (ref 4.22–5.81)
RDW: 12.7 % (ref 11.5–15.5)
WBC: 7.2 10*3/uL (ref 4.0–10.5)
nRBC: 0 % (ref 0.0–0.2)

## 2021-11-10 LAB — BASIC METABOLIC PANEL
Anion gap: 5 (ref 5–15)
BUN: 19 mg/dL (ref 6–20)
CO2: 20 mmol/L — ABNORMAL LOW (ref 22–32)
Calcium: 8.7 mg/dL — ABNORMAL LOW (ref 8.9–10.3)
Chloride: 111 mmol/L (ref 98–111)
Creatinine, Ser: 1.1 mg/dL (ref 0.61–1.24)
GFR, Estimated: >60 mL/min (ref >60)
Glucose, Bld: 96 mg/dL (ref 70–99)
Potassium: 3.9 mmol/L (ref 3.5–5.1)
Sodium: 136 mmol/L (ref 135–145)

## 2021-11-10 NOTE — ED Triage Notes (Signed)
Pt reports chest pain and SHOB that began last night. Denies abdominal pain and N/V/D.

## 2021-11-10 NOTE — ED Provider Notes (Signed)
Emergency Medicine Provider Triage Evaluation Note  Roberto Bates. , a 46 y.o. male  was evaluated in triage.  Pt complains of left-sided chest pain described as pressure that started last night.  Pain has been constant.  No alleviating factors.  Patient denies history of cardiac illness diabetes.  Per chart review, he has been admitted November 2021 for acute hypoxic respiratory failure secondary to COVID-19.  Additionally he has had a PE in the past.  He states he is not currently on any medications.  He denies recent illness and denies cough, SOB, congestion, abdominal pain, N/V/D.  Review of Systems  Positive: Chest pain Negative: Numbness, tingling, palpitations, diaphoresis  Physical Exam  BP (!) 139/93 (BP Location: Left Arm)    Pulse 79    Temp 98.9 F (37.2 C) (Oral)    Resp 18    Ht 5\' 11"  (1.803 m)    Wt 106.6 kg    SpO2 99%    BMI 32.78 kg/m  Gen:   Awake, ill appearing Resp:  Normal effort  MSK:   Moves extremities without difficulty  Other:    Medical Decision Making  Medically screening exam initiated at 2:37 PM.  Appropriate orders placed.  Roberto Bates. was informed that the remainder of the evaluation will be completed by another provider, this initial triage assessment does not replace that evaluation, and the importance of remaining in the ED until their evaluation is complete.     Tonye Pearson, [place] 11/10/21 1452    Tegeler, Gwenyth Allegra, MD 11/10/21 [phone]

## 2023-01-27 ENCOUNTER — Other Ambulatory Visit: Payer: Self-pay | Admitting: Family Medicine

## 2023-01-27 ENCOUNTER — Ambulatory Visit: Payer: BC Managed Care – PPO | Admitting: Family Medicine

## 2023-01-27 ENCOUNTER — Encounter: Payer: Self-pay | Admitting: Family Medicine

## 2023-01-27 ENCOUNTER — Ambulatory Visit (INDEPENDENT_AMBULATORY_CARE_PROVIDER_SITE_OTHER): Payer: BC Managed Care – PPO

## 2023-01-27 VITALS — BP 112/84 | HR 65 | Temp 98.0°F | Ht 71.0 in | Wt 251.0 lb

## 2023-01-27 DIAGNOSIS — Z833 Family history of diabetes mellitus: Secondary | ICD-10-CM

## 2023-01-27 DIAGNOSIS — R5383 Other fatigue: Secondary | ICD-10-CM

## 2023-01-27 DIAGNOSIS — R350 Frequency of micturition: Secondary | ICD-10-CM

## 2023-01-27 DIAGNOSIS — E669 Obesity, unspecified: Secondary | ICD-10-CM

## 2023-01-27 DIAGNOSIS — Z86718 Personal history of other venous thrombosis and embolism: Secondary | ICD-10-CM

## 2023-01-27 DIAGNOSIS — R0600 Dyspnea, unspecified: Secondary | ICD-10-CM | POA: Diagnosis not present

## 2023-01-27 DIAGNOSIS — R0609 Other forms of dyspnea: Secondary | ICD-10-CM

## 2023-01-27 DIAGNOSIS — Z7689 Persons encountering health services in other specified circumstances: Secondary | ICD-10-CM | POA: Diagnosis not present

## 2023-01-27 DIAGNOSIS — E559 Vitamin D deficiency, unspecified: Secondary | ICD-10-CM

## 2023-01-27 DIAGNOSIS — Z8701 Personal history of pneumonia (recurrent): Secondary | ICD-10-CM

## 2023-01-27 DIAGNOSIS — K760 Fatty (change of) liver, not elsewhere classified: Secondary | ICD-10-CM

## 2023-01-27 DIAGNOSIS — R9431 Abnormal electrocardiogram [ECG] [EKG]: Secondary | ICD-10-CM

## 2023-01-27 LAB — COMPREHENSIVE METABOLIC PANEL
ALT: 26 U/L (ref 0–53)
AST: 16 U/L (ref 0–37)
Albumin: 4.4 g/dL (ref 3.5–5.2)
Alkaline Phosphatase: 70 U/L (ref 39–117)
BUN: 19 mg/dL (ref 6–23)
CO2: 26 mEq/L (ref 19–32)
Calcium: 9.6 mg/dL (ref 8.4–10.5)
Chloride: 106 mEq/L (ref 96–112)
Creatinine, Ser: 1.27 mg/dL (ref 0.40–1.50)
GFR: 66.76 mL/min (ref >60.00)
Glucose, Bld: 64 mg/dL — ABNORMAL LOW (ref 70–99)
Potassium: 4.1 mEq/L (ref 3.5–5.1)
Sodium: 141 mEq/L (ref 135–145)
Total Bilirubin: 0.5 mg/dL (ref 0.2–1.2)
Total Protein: 7.3 g/dL (ref 6.0–8.3)

## 2023-01-27 LAB — TSH: TSH: 2.78 u[IU]/mL (ref 0.35–5.50)

## 2023-01-27 LAB — URINALYSIS, ROUTINE W REFLEX MICROSCOPIC
Bilirubin Urine: NEGATIVE
Hgb urine dipstick: NEGATIVE
Ketones, ur: NEGATIVE
Leukocytes,Ua: NEGATIVE
Nitrite: NEGATIVE
RBC / HPF: NONE SEEN (ref 0–?)
Specific Gravity, Urine: >=1.03 — AB (ref 1.000–1.030)
Total Protein, Urine: NEGATIVE
Urine Glucose: NEGATIVE
Urobilinogen, UA: 0.2 (ref 0.0–1.0)
pH: 6 (ref 5.0–8.0)

## 2023-01-27 LAB — CBC WITH DIFFERENTIAL/PLATELET
Basophils Absolute: 0 10*3/uL (ref 0.0–0.1)
Basophils Relative: 0.5 % (ref 0.0–3.0)
Eosinophils Absolute: 0.2 10*3/uL (ref 0.0–0.7)
Eosinophils Relative: 2.9 % (ref 0.0–5.0)
HCT: 50.2 % (ref 39.0–52.0)
Hemoglobin: 17 g/dL (ref 13.0–17.0)
Lymphocytes Relative: 44.7 % (ref 12.0–46.0)
Lymphs Abs: 3.6 10*3/uL (ref 0.7–4.0)
MCHC: 33.9 g/dL (ref 30.0–36.0)
MCV: 81.5 fl (ref 78.0–100.0)
Monocytes Absolute: 0.6 10*3/uL (ref 0.1–1.0)
Monocytes Relative: 7.9 % (ref 3.0–12.0)
Neutro Abs: 3.6 10*3/uL (ref 1.4–7.7)
Neutrophils Relative %: 44 % (ref 43.0–77.0)
Platelets: 225 10*3/uL (ref 150.0–400.0)
RBC: 6.15 Mil/uL — ABNORMAL HIGH (ref 4.22–5.81)
RDW: 13 % (ref 11.5–15.5)
WBC: 8.1 10*3/uL (ref 4.0–10.5)

## 2023-01-27 LAB — D-DIMER, QUANTITATIVE: D-Dimer, Quant: <0.19 mcg/mL FEU (ref <0.50)

## 2023-01-27 LAB — T4, FREE: Free T4: 0.92 ng/dL (ref 0.60–1.60)

## 2023-01-27 LAB — BRAIN NATRIURETIC PEPTIDE: Pro B Natriuretic peptide (BNP): 6 pg/mL (ref 0.0–100.0)

## 2023-01-27 LAB — PSA: PSA: 0.76 ng/mL (ref 0.10–4.00)

## 2023-01-27 LAB — VITAMIN B12: Vitamin B-12: 340 pg/mL (ref 211–911)

## 2023-01-27 LAB — TROPONIN I (HIGH SENSITIVITY): High Sens Troponin I: 4 ng/L (ref 2–17)

## 2023-01-27 LAB — VITAMIN D 25 HYDROXY (VIT D DEFICIENCY, FRACTURES): VITD: 11.48 ng/mL — ABNORMAL LOW (ref 30.00–100.00)

## 2023-01-27 LAB — HEMOGLOBIN A1C: Hgb A1c MFr Bld: 5.8 % (ref 4.6–6.5)

## 2023-01-27 MED ORDER — VITAMIN D (ERGOCALCIFEROL) 1.25 MG (50000 UNIT) PO CAPS: 50000.0000 [IU] | ORAL_CAPSULE | ORAL | 2 refills | Status: DC

## 2023-01-27 NOTE — Assessment & Plan Note (Signed)
Check vitamin D level 

## 2023-01-27 NOTE — Assessment & Plan Note (Signed)
Seen on CT. Counseling on significance and how to protect liver in future.

## 2023-01-27 NOTE — Assessment & Plan Note (Signed)
Ongoing since having Covid pneumonia. Check labs including D dimer due to hx of DVT. Screen for heart failure. He appears euvolemic. Follow up pending X ray and lab results.

## 2023-01-27 NOTE — Progress Notes (Signed)
New Patient Office Visit  Subjective    Patient ID: Roberto Cugini., male    DOB: May 26, 1976  Age: 47 y.o. MRN: [id number]  CC:  Chief Complaint  Patient presents with   Establish Care    Sometimes gets nauseous and has family hx of diabetes and cancer. States drinking something sweet helps the nausea. Has not ever had PCP so wants to check everything and see where he is at.     HPI Roberto Bates. presents to establish care No previous PCP.   Concerned about his weight and possibly having diabetes.  Urinary frequency.   Gradual weight gain of 30 lbs.   Hx of pneumonia from Covid. Does not feel like his breathing has returned to normal.  States he gets winded easily.  Hx of chest pain and went to the ED for evaluation in the past.   Bilateral LE edema. Improves in the morning.   Eye exam done yesterday and has new glasses.   Hx of gallbladder and appendix removal.   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    Mother passed from liver cancer.  Dad died from CHF Diabetes in mother   Outpatient Encounter Medications as of 01/27/2023  Medication Sig   [DISCONTINUED] acetaminophen (TYLENOL) 325 MG tablet Take 2 tablets (650 mg total) by mouth every 6 (six) hours as needed for mild pain, fever or headache.   [DISCONTINUED] ibuprofen (ADVIL,MOTRIN) 200 MG tablet Take 200 mg by mouth daily as needed for moderate pain.   No facility-administered encounter medications on file as of 01/27/2023.    Past Medical History:  Diagnosis Date   DVT (deep venous thrombosis) (HCC)    right leg     Past Surgical History:  Procedure Laterality Date   APPENDECTOMY     CHOLECYSTECTOMY     GALLBLADDER SURGERY     HARDWARE REMOVAL Right 09/08/2016   Procedure: HARDWARE REMOVAL RIGHT TIBIA;  Surgeon: Altamese [place], MD;  Location: [facility];  Service: Orthopedics;  Laterality: Right;   I & D EXTREMITY Right 05/28/2016   Procedure: IRRIGATION AND DEBRIDEMENT OF  OPEN FRACTURE RIGHT TIBIA;  Surgeon: Melina Schools, MD;  Location: [facility];  Service: Orthopedics;  Laterality: Right;   TIBIA IM NAIL INSERTION Right 05/28/2016   Procedure: INTRAMEDULLARY (IM) NAIL TIBIAL;  Surgeon: Melina Schools, MD;  Location: [facility];  Service: Orthopedics;  Laterality: Right;   TIBIA IM NAIL INSERTION Right 09/08/2016   Procedure: INTRAMEDULLARY (IM) NAIL RIGHT TIBIA;  Surgeon: Altamese [place], MD;  Location: [facility];  Service: Orthopedics;  Laterality: Right;    Family History  Problem Relation Age of Onset   Diabetes Mother    Cancer Mother    Heart disease Father    Alcohol abuse Father     Social History   Socioeconomic History   Marital status: Married    Spouse name: Not on file   Number of children: Not on file   Years of education: Not on file   Highest education level: Not on file  Occupational History   Not on file  Tobacco Use   Smoking status: Never   Smokeless tobacco: Never  Substance and Sexual Activity   Alcohol use: Yes    Comment: occasionally    Drug use: No   Sexual activity: Not on file  Other Topics Concern   Not on file  Social History Narrative   ** Merged History Encounter **  Social Determinants of Health   Financial Resource Strain: Not on file  Food Insecurity: Not on file  Transportation Needs: Not on file  Physical Activity: Not on file  Stress: Not on file  Social Connections: Not on file  Intimate Partner Violence: Not on file    Review of Systems  Constitutional:  Negative for chills, fever, malaise/fatigue and weight loss.  Eyes:  Negative for blurred vision and double vision.  Respiratory:  Positive for shortness of breath. Negative for cough.   Cardiovascular:  Positive for leg swelling. Negative for chest pain and palpitations.  Gastrointestinal:  Negative for abdominal pain, constipation, diarrhea, nausea and vomiting.  Genitourinary:  Positive for urgency. Negative for dysuria and frequency.   Neurological:  Negative for dizziness.        Objective    BP 112/84 (BP Location: Left Arm, Patient Position: Sitting, Cuff Size: Large)   Pulse 65   Temp 98 F (36.7 C) (Temporal)   Ht 5\' 11"  (1.803 m)   Wt 251 lb (113.9 kg)   SpO2 98%   BMI 35.01 kg/m   Physical Exam Constitutional:      General: He is not in acute distress.    Appearance: He is not ill-appearing.  HENT:     Mouth/Throat:     Mouth: Mucous membranes are moist.     Pharynx: Oropharynx is clear.  Eyes:     Extraocular Movements: Extraocular movements intact.     Conjunctiva/sclera: Conjunctivae normal.  Cardiovascular:     Rate and Rhythm: Normal rate and regular rhythm.  Pulmonary:     Effort: Pulmonary effort is normal.     Breath sounds: Normal breath sounds.  Musculoskeletal:     Cervical back: Normal range of motion and neck supple.     Right lower leg: No edema.     Left lower leg: No edema.  Skin:    General: Skin is warm and dry.  Neurological:     General: No focal deficit present.     Mental Status: He is alert and oriented to person, place, and time.     Cranial Nerves: No cranial nerve deficit.     Sensory: No sensory deficit.     Motor: No weakness.     Coordination: Coordination normal.  Psychiatric:        Mood and Affect: Mood normal.        Behavior: Behavior normal.        Thought Content: Thought content normal.         Assessment & Plan:   Problem List Items Addressed This Visit       Digestive   Fatty liver    Seen on CT. Counseling on significance and how to protect liver in future.       Relevant Orders   Comprehensive metabolic panel (Completed)     Other   DOE (dyspnea on exertion) - Primary    Ongoing since having Covid pneumonia. Check labs including D dimer due to hx of DVT. Screen for heart failure. He appears euvolemic. Follow up pending X ray and lab results.       Relevant Orders   CBC with Differential/Platelet (Completed)   Comprehensive  metabolic panel (Completed)   Brain natriuretic peptide (Completed)   DG Chest 2 View (Completed)   D-Dimer, Quantitative (Completed)   EKG 12-Lead   Troponin I (High Sensitivity) (Completed)   Family history of diabetes mellitus in first degree relative    Screen for  diabetes       Relevant Orders   Hemoglobin A1c (Completed)   Fatigue    Unclear etiology. Check labs and follow up      Relevant Orders   CBC with Differential/Platelet (Completed)   Comprehensive metabolic panel (Completed)   TSH (Completed)   VITAMIN D 25 Hydroxy (Vit-D Deficiency, Fractures) (Completed)   Vitamin B12 (Completed)   T4, free (Completed)   DG Chest 2 View (Completed)   History of DVT (deep vein thrombosis)    Due to leg trauma and surgery in past.       Relevant Orders   D-Dimer, Quantitative (Completed)   Hx of viral pneumonia   Relevant Orders   DG Chest 2 View (Completed)   Obesity (BMI 30-39.9)   Relevant Orders   Hemoglobin A1c (Completed)   Urinary frequency    Check UA and screen for DM      Relevant Orders   Hemoglobin A1c (Completed)   PSA (Completed)   Urinalysis, Routine w reflex microscopic (Completed)   Vitamin D deficiency    Check vitamin D level      Relevant Orders   VITAMIN D 25 Hydroxy (Vit-D Deficiency, Fractures) (Completed)   Other Visit Diagnoses     Encounter to establish care       Abnormal EKG       Relevant Orders   Troponin I (High Sensitivity) (Completed)      EKG shows NSR, rate 66, minimal Q wave in AVF insignificant. EKG unchanged from old one in 11/2021  Return in about 2 weeks (around 02/10/2023) for fasting CPE.   Harland Dingwall, NP-C

## 2023-01-27 NOTE — Assessment & Plan Note (Signed)
Check UA and screen for DM

## 2023-01-27 NOTE — Patient Instructions (Addendum)
Thank you for trusting [place] with your health care.  Please go downstairs for labs and a urine test.

## 2023-01-27 NOTE — Assessment & Plan Note (Signed)
Screen for diabetes

## 2023-01-27 NOTE — Assessment & Plan Note (Signed)
Unclear etiology. Check labs and follow up 

## 2023-01-27 NOTE — Assessment & Plan Note (Signed)
Due to leg trauma and surgery in past.

## 2023-02-11 ENCOUNTER — Encounter: Payer: Self-pay | Admitting: Family Medicine

## 2023-02-11 ENCOUNTER — Ambulatory Visit (INDEPENDENT_AMBULATORY_CARE_PROVIDER_SITE_OTHER): Payer: BC Managed Care – PPO | Admitting: Family Medicine

## 2023-02-11 VITALS — BP 130/82 | HR 61 | Temp 97.6°F | Ht 71.0 in | Wt 251.0 lb

## 2023-02-11 DIAGNOSIS — Z1211 Encounter for screening for malignant neoplasm of colon: Secondary | ICD-10-CM | POA: Diagnosis not present

## 2023-02-11 DIAGNOSIS — K760 Fatty (change of) liver, not elsewhere classified: Secondary | ICD-10-CM

## 2023-02-11 DIAGNOSIS — Z0001 Encounter for general adult medical examination with abnormal findings: Secondary | ICD-10-CM

## 2023-02-11 DIAGNOSIS — Z23 Encounter for immunization: Secondary | ICD-10-CM

## 2023-02-11 DIAGNOSIS — E559 Vitamin D deficiency, unspecified: Secondary | ICD-10-CM

## 2023-02-11 NOTE — Patient Instructions (Signed)
Cut back on sugar including soda.   Increase water intake.   Call and schedule with an eye doctor and dentist.   Start taking an over the counter multi vitamin. It will have vitamin D3 1,000 IUs along with other vitamins.   Follow up fasting in 2 months.    Preventive Care 73-49 Years Old, Male Preventive care refers to lifestyle choices and visits with your health care provider that can promote health and wellness. Preventive care visits are also called wellness exams. What can I expect for my preventive care visit? Counseling During your preventive care visit, your health care provider may ask about your: Medical history, including: Past medical problems. Family medical history. Current health, including: Emotional well-being. Home life and relationship well-being. Sexual activity. Lifestyle, including: Alcohol, nicotine or tobacco, and drug use. Access to firearms. Diet, exercise, and sleep habits. Safety issues such as seatbelt and bike helmet use. Sunscreen use. Work and work Statistician. Physical exam Your health care provider will check your: Height and weight. These may be used to calculate your BMI (body mass index). BMI is a measurement that tells if you are at a healthy weight. Waist circumference. This measures the distance around your waistline. This measurement also tells if you are at a healthy weight and may help predict your risk of certain diseases, such as type 2 diabetes and high blood pressure. Heart rate and blood pressure. Body temperature. Skin for abnormal spots. What immunizations do I need?  Vaccines are usually given at various ages, according to a schedule. Your health care provider will recommend vaccines for you based on your age, medical history, and lifestyle or other factors, such as travel or where you work. What tests do I need? Screening Your health care provider may recommend screening tests for certain conditions. This may  include: Lipid and cholesterol levels. Diabetes screening. This is done by checking your blood sugar (glucose) after you have not eaten for a while (fasting). Hepatitis B test. Hepatitis C test. HIV (human immunodeficiency virus) test. STI (sexually transmitted infection) testing, if you are at risk. Lung cancer screening. Prostate cancer screening. Colorectal cancer screening. Talk with your health care provider about your test results, treatment options, and if necessary, the need for more tests. Follow these instructions at home: Eating and drinking  Eat a diet that includes fresh fruits and vegetables, whole grains, lean protein, and low-fat dairy products. Take vitamin and mineral supplements as recommended by your health care provider. Do not drink alcohol if your health care provider tells you not to drink. If you drink alcohol: Limit how much you have to 0-2 drinks a day. Know how much alcohol is in your drink. In the [place] one drink equals one 12 oz bottle of beer (355 mL), one 5 oz glass of wine (148 mL), or one 1 oz glass of hard liquor (44 mL). Lifestyle Brush your teeth every morning and night with fluoride toothpaste. Floss one time each day. Exercise for at least 30 minutes 5 or more days each week. Do not use any products that contain nicotine or tobacco. These products include cigarettes, chewing tobacco, and vaping devices, such as e-cigarettes. If you need help quitting, ask your health care provider. Do not use drugs. If you are sexually active, practice safe sex. Use a condom or other form of protection to prevent STIs. Take aspirin only as told by your health care provider. Make sure that you understand how much to take and what form to take.  Work with your health care provider to find out whether it is safe and beneficial for you to take aspirin daily. Find healthy ways to manage stress, such as: Meditation, yoga, or listening to music. Journaling. Talking to a  trusted person. Spending time with friends and family. Minimize exposure to UV radiation to reduce your risk of skin cancer. Safety Always wear your seat belt while driving or riding in a vehicle. Do not drive: If you have been drinking alcohol. Do not ride with someone who has been drinking. When you are tired or distracted. While texting. If you have been using any mind-altering substances or drugs. Wear a helmet and other protective equipment during sports activities. If you have firearms in your house, make sure you follow all gun safety procedures. What's next? Go to your health care provider once a year for an annual wellness visit. Ask your health care provider how often you should have your eyes and teeth checked. Stay up to date on all vaccines. This information is not intended to replace advice given to you by your health care provider. Make sure you discuss any questions you have with your health care provider. Document Revised: 04/23/2021 Document Reviewed: 04/23/2021 Elsevier Patient Education  [place].

## 2023-02-11 NOTE — Progress Notes (Signed)
Complete physical exam  Patient: Roberto Bates.   DOB: 1976-03-14   47 y.o. Male  MRN: [id number]  Subjective:    Chief Complaint  Patient presents with   Annual Exam    Last ate around 12   He is fairly new to the practice and here for a complete physical exam.  States he had a low blood sugar and felt shaky and nauseated.  Drinks a lot of soda.   Denies smoking. Alcohol some nights.   Hx of fatty liver   Married. Works in [employer].  Tdap due this year and would like to get it today.     Health Maintenance  Topic Date Due   Hepatitis C Screening: USPSTF Recommendation to screen - Ages 61-79 yo.  Never done   Colon Cancer Screening  Never done   COVID-19 Vaccine (1) 02/12/2023*   Flu Shot  06/10/2023   DTaP/Tdap/Td vaccine (3 - Td or Tdap) 02/10/2033   HIV Screening  Completed   HPV Vaccine  Aged Out  *Topic was postponed. The date shown is not the original due date.    Wears seatbelt always, uses sunscreen, smoke detectors in home and functioning, does not text while driving, feels safe in home environment.  Depression screening:    02/11/2023    2:53 PM 01/27/2023    1:52 PM  Depression screen PHQ 2/9  Decreased Interest 0 0  Down, Depressed, Hopeless 0 0  PHQ - 2 Score 0 0   Anxiety Screening:     No data to display          No regular dental or eye exams.   Patient Active Problem List   Diagnosis Date Noted   Fatigue 01/27/2023   Fatty liver 01/27/2023   DOE (dyspnea on exertion) 01/27/2023   Obesity (BMI 30-39.9) 01/27/2023   Family history of diabetes mellitus in first degree relative 01/27/2023   Hx of viral pneumonia 01/27/2023   Urinary frequency 01/27/2023   History of DVT (deep vein thrombosis) 01/27/2023   Pneumonia due to COVID-19 virus 10/06/2020   Vitamin D deficiency 09/09/2016   Tibia and fibula open fracture, right, type I or II, with nonunion, subsequent encounter 09/08/2016   S/p tibial fracture    AKI (acute kidney  injury)    Leukocytosis    Tibia fracture 05/28/2016   Hypoglycemia 08/10/2013   Past Medical History:  Diagnosis Date   DVT (deep venous thrombosis)    right leg    Past Surgical History:  Procedure Laterality Date   APPENDECTOMY     CHOLECYSTECTOMY     GALLBLADDER SURGERY     HARDWARE REMOVAL Right 09/08/2016   Procedure: HARDWARE REMOVAL RIGHT TIBIA;  Surgeon: Altamese [place], MD;  Location: [facility];  Service: Orthopedics;  Laterality: Right;   I & D EXTREMITY Right 05/28/2016   Procedure: IRRIGATION AND DEBRIDEMENT OF OPEN FRACTURE RIGHT TIBIA;  Surgeon: Melina Schools, MD;  Location: [place];  Service: Orthopedics;  Laterality: Right;   TIBIA IM NAIL INSERTION Right 05/28/2016   Procedure: INTRAMEDULLARY (IM) NAIL TIBIAL;  Surgeon: Melina Schools, MD;  Location: [place];  Service: Orthopedics;  Laterality: Right;   TIBIA IM NAIL INSERTION Right 09/08/2016   Procedure: INTRAMEDULLARY (IM) NAIL RIGHT TIBIA;  Surgeon: Altamese [place], MD;  Location: [place];  Service: Orthopedics;  Laterality: Right;   Social History   Tobacco Use   Smoking status: Never   Smokeless tobacco: Never  Substance Use Topics   Alcohol  use: Yes    Comment: occasionally    Drug use: No      Patient Care Team: Girtha Rm, NP-C as PCP - General (Family Medicine)   Outpatient Medications Prior to Visit  Medication Sig   Vitamin D, Ergocalciferol, (DRISDOL) 1.25 MG (50000 UNIT) CAPS capsule Take 1 capsule (50,000 Units total) by mouth every 7 (seven) days.   No facility-administered medications prior to visit.    Review of Systems  Constitutional:  Negative for chills and fever.  HENT:  Negative for congestion, ear pain, sinus pain and sore throat.   Eyes:  Negative for blurred vision, double vision and pain.  Respiratory:  Negative for cough, shortness of breath and wheezing.   Cardiovascular:  Negative for chest pain, palpitations and leg swelling.  Gastrointestinal:  Negative for abdominal pain,  constipation, diarrhea, nausea and vomiting.  Genitourinary:  Negative for dysuria, frequency and urgency.  Musculoskeletal:  Negative for back pain, joint pain and myalgias.  Skin:  Negative for rash.  Neurological:  Negative for dizziness, tingling, focal weakness and headaches.  Endo/Heme/Allergies:  Does not bruise/bleed easily.  Psychiatric/Behavioral:  Negative for depression. The patient is not nervous/anxious.        Objective:    BP 130/82 (BP Location: Left Arm, Patient Position: Sitting, Cuff Size: Large)   Pulse 61   Temp 97.6 F (36.4 C) (Temporal)   Ht 5\' 11"  (1.803 m)   Wt 251 lb (113.9 kg)   SpO2 97%   BMI 35.01 kg/m  BP Readings from Last 3 Encounters:  02/11/23 130/82  01/27/23 112/84  11/10/21 (!) 139/93   Wt Readings from Last 3 Encounters:  02/11/23 251 lb (113.9 kg)  01/27/23 251 lb (113.9 kg)  11/10/21 235 lb (106.6 kg)    Physical Exam Constitutional:      General: He is not in acute distress.    Appearance: He is not ill-appearing.  HENT:     Right Ear: Tympanic membrane, ear canal and external ear normal.     Left Ear: Tympanic membrane, ear canal and external ear normal.     Nose: Nose normal.     Mouth/Throat:     Mouth: Mucous membranes are moist.     Pharynx: Oropharynx is clear.  Eyes:     Extraocular Movements: Extraocular movements intact.     Conjunctiva/sclera: Conjunctivae normal.     Pupils: Pupils are equal, round, and reactive to light.  Neck:     Thyroid: No thyroid mass, thyromegaly or thyroid tenderness.  Cardiovascular:     Rate and Rhythm: Normal rate and regular rhythm.     Pulses: Normal pulses.     Heart sounds: Normal heart sounds.  Pulmonary:     Effort: Pulmonary effort is normal.     Breath sounds: Normal breath sounds.  Abdominal:     General: Bowel sounds are normal. There is no distension.     Palpations: Abdomen is soft.     Tenderness: There is no abdominal tenderness. There is no right CVA tenderness,  left CVA tenderness, guarding or rebound.  Musculoskeletal:        General: Normal range of motion.     Cervical back: Normal range of motion and neck supple. No tenderness.     Right lower leg: No edema.     Left lower leg: No edema.  Lymphadenopathy:     Cervical: No cervical adenopathy.  Skin:    General: Skin is warm and dry.  Findings: No lesion or rash.  Neurological:     General: No focal deficit present.     Mental Status: He is alert and oriented to person, place, and time.     Cranial Nerves: No cranial nerve deficit.     Sensory: No sensory deficit.     Motor: No weakness.  Psychiatric:        Mood and Affect: Mood normal.        Behavior: Behavior normal.        Thought Content: Thought content normal.      No results found for any visits on 02/11/23.    Assessment & Plan:    Routine Health Maintenance and Physical Exam  Problem List Items Addressed This Visit       Digestive   Fatty liver     Other   Vitamin D deficiency   Other Visit Diagnoses     Encounter for general adult medical examination with abnormal findings    -  Primary   Screen for colon cancer       Relevant Orders   Ambulatory referral to Gastroenterology   Need for diphtheria-tetanus-pertussis (Tdap) vaccine       Relevant Orders   Tdap vaccine greater than or equal to 7yo IM (Completed)      Preventive health care reviewed.  Counseling on healthy lifestyle including diet and exercise.  Recommend regular dental and eye exams.  Immunizations reviewed.  Tdap updated since he works in [employer]. Counseling on vaccine done.  Referral for his first colonoscopy.  Encourage cutting back on soda and sugar and increasing water. Recommend weight loss for fatty liver.  Continue vitamin D weekly.  Discussed safety. Follow up in 2 months and recheck vitamin D and check lipids (he is not fasting today)  Return for f/u fasting in 2 months for lipids and recheck vitamin D.     Harland Dingwall, NP-C

## 2023-02-12 ENCOUNTER — Encounter: Payer: Self-pay | Admitting: Gastroenterology

## 2023-03-01 ENCOUNTER — Ambulatory Visit (AMBULATORY_SURGERY_CENTER): Payer: BC Managed Care – PPO | Admitting: *Deleted

## 2023-03-01 VITALS — Ht 71.0 in | Wt 249.0 lb

## 2023-03-01 DIAGNOSIS — Z1211 Encounter for screening for malignant neoplasm of colon: Secondary | ICD-10-CM

## 2023-03-01 MED ORDER — NA SULFATE-K SULFATE-MG SULF 17.5-3.13-1.6 GM/177ML PO SOLN: 1.0000 | Freq: Once | ORAL | 0 refills | Status: AC

## 2023-03-01 NOTE — Progress Notes (Signed)
Pt's name and DOB verified at the beginning of the pre-visit.  Pt denies any difficulty with ambulating.  No egg or soy allergy known to patient  No issues known to pt with past sedation with any surgeries or procedures Pt denies having issues being intubated Pt has no issues moving head neck or swallowing No FH of Malignant Hyperthermia Pt is not on diet pills Pt is not on home 02  Pt is not on blood thinners  Pt denies issues with constipation  Pt is not on dialysis Pt denies any upcoming cardiac testing Pt encouraged to use to use Singlecare or Goodrx to reduce cost  Patient's chart reviewed by John Nulty CNRA prior to pre-visit and patient appropriate for the [facility].  Pre-visit completed and red dot placed by patient's name on their procedure day (on provider's schedule).  . Visit by phone Pt states weight is  Instructed pt why it is important to and  to call if they have any changes in health or new medications. Directed them to the # given and on instructions.   Pt states they will.  Instructions reviewed with pt and pt states understanding. Instructed to review again prior to procedure. Pt states they will.  Instructions sent by mail with coupon and by my chart   

## 2023-03-11 ENCOUNTER — Encounter: Payer: Self-pay | Admitting: Gastroenterology

## 2023-03-17 ENCOUNTER — Other Ambulatory Visit: Payer: Self-pay | Admitting: Family Medicine

## 2023-03-17 ENCOUNTER — Encounter: Payer: Self-pay | Admitting: Certified Registered Nurse Anesthetist

## 2023-03-17 DIAGNOSIS — E559 Vitamin D deficiency, unspecified: Secondary | ICD-10-CM

## 2023-03-22 ENCOUNTER — Encounter: Payer: BC Managed Care – PPO | Admitting: Gastroenterology

## 2023-04-14 ENCOUNTER — Ambulatory Visit: Payer: BC Managed Care – PPO | Admitting: Family Medicine

## 2023-04-14 ENCOUNTER — Encounter: Payer: Self-pay | Admitting: Family Medicine

## 2023-04-14 VITALS — BP 118/78 | HR 59 | Temp 97.6°F | Ht 71.0 in | Wt 250.0 lb

## 2023-04-14 DIAGNOSIS — E559 Vitamin D deficiency, unspecified: Secondary | ICD-10-CM

## 2023-04-14 DIAGNOSIS — K760 Fatty (change of) liver, not elsewhere classified: Secondary | ICD-10-CM

## 2023-04-14 DIAGNOSIS — E669 Obesity, unspecified: Secondary | ICD-10-CM

## 2023-04-14 NOTE — Progress Notes (Signed)
Subjective:     Patient ID: Roberto Naegeli., male    DOB: 12-06-1975, 47 y.o.   MRN: [id number]  Chief Complaint  Patient presents with   Medical Management of Chronic Issues    Recheck vit D and lipids?    HPI  Discussed the use of AI scribe software for clinical note transcription with the patient, who gave verbal consent to proceed.  History of Present Illness         He is here to follow up on vitamin D def and for fasting lipids. He forgot to fast today.   Complains of ongoing fatigue. No new symptoms.   Active- works 2 jobs.   Completed vitamin D prescription.   Health Maintenance Due  Topic Date Due   Hepatitis C Screening  Never done   Colonoscopy  Never done    Past Medical History:  Diagnosis Date   DVT (deep venous thrombosis) (HCC)    right leg     Past Surgical History:  Procedure Laterality Date   APPENDECTOMY     CHOLECYSTECTOMY     GALLBLADDER SURGERY     HARDWARE REMOVAL Right 09/08/2016   Procedure: HARDWARE REMOVAL RIGHT TIBIA;  Surgeon: Myrene Galas, MD;  Location: [facility];  Service: Orthopedics;  Laterality: Right;   I & D EXTREMITY Right 05/28/2016   Procedure: IRRIGATION AND DEBRIDEMENT OF OPEN FRACTURE RIGHT TIBIA;  Surgeon: Venita Lick, MD;  Location: [facility];  Service: Orthopedics;  Laterality: Right;   TIBIA IM NAIL INSERTION Right 05/28/2016   Procedure: INTRAMEDULLARY (IM) NAIL TIBIAL;  Surgeon: Venita Lick, MD;  Location: [facility];  Service: Orthopedics;  Laterality: Right;   TIBIA IM NAIL INSERTION Right 09/08/2016   Procedure: INTRAMEDULLARY (IM) NAIL RIGHT TIBIA;  Surgeon: Myrene Galas, MD;  Location: [facility];  Service: Orthopedics;  Laterality: Right;    Family History  Problem Relation Age of Onset   Liver cancer Mother    Diabetes Mother    Cancer Mother    Heart disease Father    Alcohol abuse Father    Colon cancer Neg Hx    Colon polyps Neg Hx    Esophageal cancer Neg Hx    Stomach cancer Neg Hx    Rectal cancer  Neg Hx     Social History   Socioeconomic History   Marital status: Married    Spouse name: Not on file   Number of children: Not on file   Years of education: Not on file   Highest education level: Not on file  Occupational History   Not on file  Tobacco Use   Smoking status: Never   Smokeless tobacco: Never  Substance and Sexual Activity   Alcohol use: Yes    Comment: occasionally    Drug use: No   Sexual activity: Not on file  Other Topics Concern   Not on file  Social History Narrative   ** Merged History Encounter **       Social Determinants of Health   Financial Resource Strain: Not on file  Food Insecurity: Not on file  Transportation Needs: Not on file  Physical Activity: Not on file  Stress: Not on file  Social Connections: Not on file  Intimate Partner Violence: Not on file    Outpatient Medications Prior to Visit  Medication Sig Dispense Refill   Vitamin D, Ergocalciferol, (DRISDOL) 1.25 MG (50000 UNIT) CAPS capsule Take 1 capsule (50,000 Units total) by mouth every 7 (seven) days. (Patient not  taking: Reported on 04/14/2023) 4 capsule 2   No facility-administered medications prior to visit.    No Known Allergies  Review of Systems  Constitutional:  Positive for malaise/fatigue. Negative for chills and fever.  Respiratory:  Negative for shortness of breath.   Cardiovascular:  Negative for chest pain, palpitations and leg swelling.  Gastrointestinal:  Negative for abdominal pain, constipation, diarrhea, nausea and vomiting.  Genitourinary:  Negative for dysuria, frequency and urgency.  Neurological:  Negative for dizziness and weakness.       Objective:    Physical Exam Constitutional:      General: He is not in acute distress.    Appearance: He is not ill-appearing.  Eyes:     Extraocular Movements: Extraocular movements intact.     Conjunctiva/sclera: Conjunctivae normal.  Cardiovascular:     Rate and Rhythm: Normal rate.  Pulmonary:      Effort: Pulmonary effort is normal.  Musculoskeletal:     Cervical back: Normal range of motion.  Skin:    General: Skin is warm and dry.  Neurological:     General: No focal deficit present.     Mental Status: He is alert and oriented to person, place, and time.  Psychiatric:        Mood and Affect: Mood normal.        Behavior: Behavior normal.        Thought Content: Thought content normal.      BP 118/78 (BP Location: Left Arm, Patient Position: Sitting, Cuff Size: Large)   Pulse (!) 59   Temp 97.6 F (36.4 C) (Temporal)   Ht 5\' 11"  (1.803 m)   Wt 250 lb (113.4 kg)   SpO2 98%   BMI 34.87 kg/m  Wt Readings from Last 3 Encounters:  04/14/23 250 lb (113.4 kg)  03/01/23 249 lb (112.9 kg)  02/11/23 251 lb (113.9 kg)       Assessment & Plan:   Problem List Items Addressed This Visit       Digestive   Fatty liver   Relevant Orders   Lipid panel     Other   Obesity (BMI 30-39.9)   Relevant Orders   Lipid panel   Vitamin D deficiency - Primary   Relevant Orders   VITAMIN D 25 Hydroxy (Vit-D Deficiency, Fractures)   Reviewed labs from previous visit.  He will return for fasting lipids and recheck vitamin D.  Recommend starting MVI.  Reschedule colonoscopy.  Follow up pending lab results.   I have discontinued Roberto Epley Jr.'s Vitamin D (Ergocalciferol).  No orders of the defined types were placed in this encounter.

## 2023-04-14 NOTE — Patient Instructions (Signed)
Return for fasting labs in the next week or two. (Fasting means nothing to eat or drink except water for at least 6 hours).   Start taking an over the counter mulit-vitamin.   Work on weight loss. Healthy, low fat diet and exercise.   Stay hydrated.   Try to get at least 7 hours of sleep per night.   Call and reschedule your colonoscopy.   We will be in touch with your results and recommendations.

## 2023-04-15 ENCOUNTER — Other Ambulatory Visit (INDEPENDENT_AMBULATORY_CARE_PROVIDER_SITE_OTHER): Payer: BC Managed Care – PPO

## 2023-04-15 DIAGNOSIS — E559 Vitamin D deficiency, unspecified: Secondary | ICD-10-CM | POA: Diagnosis not present

## 2023-04-15 DIAGNOSIS — K760 Fatty (change of) liver, not elsewhere classified: Secondary | ICD-10-CM

## 2023-04-15 DIAGNOSIS — E669 Obesity, unspecified: Secondary | ICD-10-CM | POA: Diagnosis not present

## 2023-04-15 LAB — LIPID PANEL
Cholesterol: 190 mg/dL (ref 0–200)
HDL: 33.6 mg/dL — ABNORMAL LOW (ref >39.00)
NonHDL: 156.48
Total CHOL/HDL Ratio: 6
Triglycerides: 258 mg/dL — ABNORMAL HIGH (ref 0.0–149.0)
VLDL: 51.6 mg/dL — ABNORMAL HIGH (ref 0.0–40.0)

## 2023-04-15 LAB — VITAMIN D 25 HYDROXY (VIT D DEFICIENCY, FRACTURES): VITD: 29.18 ng/mL — ABNORMAL LOW (ref 30.00–100.00)

## 2023-04-15 LAB — LDL CHOLESTEROL, DIRECT: Direct LDL: 137 mg/dL

## 2023-05-06 ENCOUNTER — Other Ambulatory Visit: Payer: Self-pay | Admitting: Family Medicine

## 2023-12-22 DIAGNOSIS — R070 Pain in throat: Secondary | ICD-10-CM | POA: Diagnosis not present

## 2023-12-22 DIAGNOSIS — R051 Acute cough: Secondary | ICD-10-CM | POA: Diagnosis not present

## 2023-12-22 DIAGNOSIS — R0981 Nasal congestion: Secondary | ICD-10-CM | POA: Diagnosis not present

## 2023-12-26 DIAGNOSIS — R0981 Nasal congestion: Secondary | ICD-10-CM | POA: Diagnosis not present

## 2023-12-26 DIAGNOSIS — R062 Wheezing: Secondary | ICD-10-CM | POA: Diagnosis not present

## 2023-12-26 DIAGNOSIS — J208 Acute bronchitis due to other specified organisms: Secondary | ICD-10-CM | POA: Diagnosis not present

## 2023-12-26 DIAGNOSIS — R051 Acute cough: Secondary | ICD-10-CM | POA: Diagnosis not present
